# Patient Record
Sex: Female | Born: 1970 | Race: Black or African American | Hispanic: No | Marital: Single | Smoking: Current every day smoker
Health system: Southern US, Community
[De-identification: ages and names within clinical notes are randomized; demographics above are authoritative.]

## PROBLEM LIST (undated history)

## (undated) DIAGNOSIS — F32A Depression, unspecified: Secondary | ICD-10-CM

## (undated) DIAGNOSIS — F329 Major depressive disorder, single episode, unspecified: Secondary | ICD-10-CM

## (undated) DIAGNOSIS — G43909 Migraine, unspecified, not intractable, without status migrainosus: Secondary | ICD-10-CM

## (undated) DIAGNOSIS — A64 Unspecified sexually transmitted disease: Secondary | ICD-10-CM

## (undated) HISTORY — DX: Migraine, unspecified, not intractable, without status migrainosus: G43.909

## (undated) HISTORY — DX: Major depressive disorder, single episode, unspecified: F32.9

## (undated) HISTORY — DX: Depression, unspecified: F32.A

## (undated) HISTORY — DX: Unspecified sexually transmitted disease: A64

## (undated) HISTORY — PX: TONSILLECTOMY: SHX5217

---

## 1996-12-09 HISTORY — PX: TUBAL LIGATION: SHX77

## 2002-01-08 ENCOUNTER — Emergency Department (HOSPITAL_COMMUNITY): Admission: EM | Admit: 2002-01-08 | Discharge: 2002-01-08 | Payer: Self-pay | Admitting: *Deleted

## 2003-11-22 ENCOUNTER — Inpatient Hospital Stay (HOSPITAL_COMMUNITY): Admission: AD | Admit: 2003-11-22 | Discharge: 2003-11-22 | Payer: Self-pay | Admitting: *Deleted

## 2005-08-01 ENCOUNTER — Other Ambulatory Visit: Admission: RE | Admit: 2005-08-01 | Discharge: 2005-08-01 | Payer: Self-pay | Admitting: Gynecology

## 2006-02-17 ENCOUNTER — Emergency Department (HOSPITAL_COMMUNITY): Admission: EM | Admit: 2006-02-17 | Discharge: 2006-02-17 | Payer: Self-pay | Admitting: Family Medicine

## 2006-08-12 ENCOUNTER — Other Ambulatory Visit: Admission: RE | Admit: 2006-08-12 | Discharge: 2006-08-12 | Payer: Self-pay | Admitting: Gynecology

## 2006-11-25 ENCOUNTER — Emergency Department (HOSPITAL_COMMUNITY): Admission: EM | Admit: 2006-11-25 | Discharge: 2006-11-25 | Payer: Self-pay | Admitting: Family Medicine

## 2006-12-09 HISTORY — PX: BREAST SURGERY: SHX581

## 2007-03-13 ENCOUNTER — Encounter: Admission: RE | Admit: 2007-03-13 | Discharge: 2007-03-13 | Payer: Self-pay | Admitting: Gynecology

## 2007-12-23 ENCOUNTER — Other Ambulatory Visit: Admission: RE | Admit: 2007-12-23 | Discharge: 2007-12-23 | Payer: Self-pay | Admitting: Gynecology

## 2009-04-17 ENCOUNTER — Inpatient Hospital Stay (HOSPITAL_COMMUNITY): Admission: EM | Admit: 2009-04-17 | Discharge: 2009-04-20 | Payer: Self-pay | Admitting: Emergency Medicine

## 2009-05-05 ENCOUNTER — Ambulatory Visit: Payer: Self-pay | Admitting: Women's Health

## 2009-08-24 ENCOUNTER — Emergency Department (HOSPITAL_COMMUNITY): Admission: EM | Admit: 2009-08-24 | Discharge: 2009-08-25 | Payer: Self-pay | Admitting: Emergency Medicine

## 2009-09-29 ENCOUNTER — Ambulatory Visit: Payer: Self-pay | Admitting: Women's Health

## 2010-05-18 ENCOUNTER — Ambulatory Visit: Payer: Self-pay | Admitting: Women's Health

## 2010-05-18 ENCOUNTER — Other Ambulatory Visit: Admission: RE | Admit: 2010-05-18 | Discharge: 2010-05-18 | Payer: Self-pay | Admitting: Obstetrics and Gynecology

## 2010-08-03 ENCOUNTER — Emergency Department (HOSPITAL_COMMUNITY): Admission: EM | Admit: 2010-08-03 | Discharge: 2010-08-03 | Payer: Self-pay | Admitting: Emergency Medicine

## 2011-02-21 LAB — WET PREP, GENITAL
Trich, Wet Prep: NONE SEEN
Yeast Wet Prep HPF POC: NONE SEEN

## 2011-02-21 LAB — URINALYSIS, ROUTINE W REFLEX MICROSCOPIC
Glucose, UA: NEGATIVE mg/dL
Hgb urine dipstick: NEGATIVE
Specific Gravity, Urine: 1.022 (ref 1.005–1.030)
pH: 6.5 (ref 5.0–8.0)

## 2011-02-21 LAB — GC/CHLAMYDIA PROBE AMP, GENITAL

## 2011-02-21 LAB — POCT PREGNANCY, URINE

## 2011-03-19 LAB — COMPREHENSIVE METABOLIC PANEL
ALT: 17 U/L (ref 0–35)
AST: 18 U/L (ref 0–37)
Albumin: 2.9 g/dL — ABNORMAL LOW (ref 3.5–5.2)
Alkaline Phosphatase: 56 U/L (ref 39–117)
BUN: 6 mg/dL (ref 6–23)
CO2: 23 mEq/L (ref 19–32)
CO2: 25 mEq/L (ref 19–32)
Chloride: 104 mEq/L (ref 96–112)
Chloride: 106 mEq/L (ref 96–112)
Creatinine, Ser: 0.51 mg/dL (ref 0.4–1.2)
GFR calc non Af Amer: >60 mL/min (ref >60)
GFR calc non Af Amer: >60 mL/min (ref >60)
Glucose, Bld: 118 mg/dL — ABNORMAL HIGH (ref 70–99)
Potassium: 3.6 mEq/L (ref 3.5–5.1)
Sodium: 135 mEq/L (ref 135–145)
Total Bilirubin: 0.6 mg/dL (ref 0.3–1.2)
Total Bilirubin: 1 mg/dL (ref 0.3–1.2)

## 2011-03-19 LAB — CULTURE, BLOOD (ROUTINE X 2)

## 2011-03-19 LAB — WET PREP, GENITAL: Yeast Wet Prep HPF POC: NONE SEEN

## 2011-03-19 LAB — DIFFERENTIAL
Basophils Absolute: 0 10*3/uL (ref 0.0–0.1)
Basophils Relative: 0 % (ref 0–1)
Eosinophils Absolute: 0 10*3/uL (ref 0.0–0.7)
Neutro Abs: 10.7 10*3/uL — ABNORMAL HIGH (ref 1.7–7.7)
Neutrophils Relative %: 87 % — ABNORMAL HIGH (ref 43–77)

## 2011-03-19 LAB — IRON AND TIBC
Iron: 16 ug/dL — ABNORMAL LOW (ref 42–135)
Saturation Ratios: 6 % — ABNORMAL LOW (ref 20–55)
TIBC: 276 ug/dL (ref 250–470)

## 2011-03-19 LAB — URINALYSIS, ROUTINE W REFLEX MICROSCOPIC
Glucose, UA: NEGATIVE mg/dL
Ketones, ur: >80 mg/dL — AB
Protein, ur: 100 mg/dL — AB
pH: 6 (ref 5.0–8.0)

## 2011-03-19 LAB — CBC
HCT: 32.6 % — ABNORMAL LOW (ref 36.0–46.0)
Hemoglobin: 12.4 g/dL (ref 12.0–15.0)
MCHC: 34.3 g/dL (ref 30.0–36.0)
MCV: 84.2 fL (ref 78.0–100.0)
Platelets: 112 10*3/uL — ABNORMAL LOW (ref 150–400)
Platelets: 126 10*3/uL — ABNORMAL LOW (ref 150–400)
Platelets: 134 10*3/uL — ABNORMAL LOW (ref 150–400)
RBC: 3.82 MIL/uL — ABNORMAL LOW (ref 3.87–5.11)
RBC: 3.83 MIL/uL — ABNORMAL LOW (ref 3.87–5.11)
RBC: 4.31 MIL/uL (ref 3.87–5.11)
RDW: 14.1 % (ref 11.5–15.5)
WBC: 11.5 10*3/uL — ABNORMAL HIGH (ref 4.0–10.5)
WBC: 7.5 10*3/uL (ref 4.0–10.5)
WBC: 9.2 10*3/uL (ref 4.0–10.5)

## 2011-03-19 LAB — BASIC METABOLIC PANEL
BUN: 1 mg/dL — ABNORMAL LOW (ref 6–23)
Calcium: 8.5 mg/dL (ref 8.4–10.5)
Creatinine, Ser: 0.49 mg/dL (ref 0.4–1.2)
GFR calc non Af Amer: >60 mL/min (ref >60)

## 2011-03-19 LAB — URINE CULTURE: Colony Count: >=100000

## 2011-03-19 LAB — HEMOCCULT GUIAC POC 1CARD (OFFICE): Fecal Occult Bld: NEGATIVE

## 2011-03-19 LAB — URINE MICROSCOPIC-ADD ON

## 2011-03-19 LAB — POCT I-STAT, CHEM 8
Chloride: 103 mEq/L (ref 96–112)
HCT: 37 % (ref 36.0–46.0)
Hemoglobin: 12.6 g/dL (ref 12.0–15.0)
Potassium: 3 mEq/L — ABNORMAL LOW (ref 3.5–5.1)
Sodium: 138 mEq/L (ref 135–145)

## 2011-03-19 LAB — POCT PREGNANCY, URINE

## 2011-03-19 LAB — GC/CHLAMYDIA PROBE AMP, GENITAL

## 2011-03-19 LAB — VITAMIN B12: Vitamin B-12: 572 pg/mL (ref 211–911)

## 2011-04-23 NOTE — Discharge Summary (Signed)
Whitney Gay, Whitney Gay                ACCOUNT NO.:  [id number]   MEDICAL RECORD NO.:  [id number]          PATIENT TYPE:  INP   LOCATION:  3039                         FACILITY:  [facility]   PHYSICIAN:  Theodosia Paling, MD    DATE OF BIRTH:  1971/07/26   DATE OF ADMISSION:  04/17/2009  DATE OF DISCHARGE:  04/20/2009                               DISCHARGE SUMMARY   PRIMARY CARE PHYSICIAN:  Dr. Juanda Chance at [facility].  She is a gynecologist.  The patient does not have a primary care  physician at this time.   HISTORY OF PRESENT ILLNESS:  Please refer to the excellent admission  note dictated by Dr. Sabino Donovan.   DISCHARGE DIAGNOSES:  1. Urinary tract infection due to E coli sensitive ciprofloxacin.  2. Bacteria aeruginosa.  3. History of depression.  4. Anemia.  5. Mild thrombocytopenia.   DISCHARGE MEDICATIONS:  1. Ciprofloxacin 500 mg p.o. q.12h for 10 days.  2,  Tinidazole 2 g p.o. daily for 2 days.   HOSPITAL COURSE:  The following issues were addressed during the  hospitalization.  1. Urinary tract infection.  The patient remained hemodynamically      stable and defervesced.  WBC count was normal on discharge.  She      was tolerating p.o. intake very well.  Urine culture for E coli      sensitive to ciprofloxacin.  She will completing a 10-day course of      ciprofloxacin and follow up with Dr. Juanda Chance in one week.  2. Bacteria aeruginosa.  I am giving her a 2-day supply of Tinidazole      .  3. Depression.  The patient did not have any exacerbation, suicidal      ideation, etc.  4. Anemia.  The patient did not have any need for transfusion.      However, her blood work can be done as an outpatient.  However, I      am sending iron studies, , B12, folate level to rule out her, stool      for occult blood and HIV testing for finding out the cause of      anemia as well as thrombocytopenia.  5. Thrombocytopenia.  I am sending a HIV panel on her .   Imaging performed Apr 17, 2009, transvaginal ultrasound that showed 2 cm  hemorrhagic right ovarian cyst and left ovary was normal.  Ultrasound of  the pelvis was normal.   DISPOSITION:  The patient is planning to follow up with [name], PA  at [place], in one week for further evaluation and management of  anemia and thrombocytopenia and referral for primary care physician.  I have discussed above plan with her and [name] will be following  up on her anemia work up.   Discharge time:  1 hour 15 minutes.  Please CC above dc summary to [name] at [place], Gyne[facility] [place]      Theodosia Paling, MD  Electronically Signed     NP/MEDQ  D:  04/20/2009  T:  04/20/2009  Job:  [id number]  cc:   Juanda Chance, M.D.

## 2011-04-23 NOTE — H&P (Signed)
NAMELAVONDA, Whitney Gay                ACCOUNT NO.:  [id number]   MEDICAL RECORD NO.:  [id number]          PATIENT TYPE:  INP   LOCATION:  3039                         FACILITY:  [facility]   PHYSICIAN:  Sabino Donovan, MD        DATE OF BIRTH:  20-Mar-1971   DATE OF ADMISSION:  04/17/2009  DATE OF DISCHARGE:                              HISTORY & PHYSICAL   CHIEF COMPLAINT:  Back pain.   HISTORY OF PRESENT ILLNESS:  The patient is a 40 year old African  American female with a history of depression and eczema presented with  back pain for last 3 days.  Reports that pain started about 3 days ago,  has a lower back pain and last night became severely worse.  She also  reports fever and chills associated with it, although did not check her  temperature at home.  Otherwise, denies any nausea, vomiting, diarrhea,  constipation, chest pain, or shortness of breath.  The patient does  report that she is sexually active, reports clear vaginal discharge with  foul odor.  In the ER, the patient was noted to have UTI with multiple  clue cells and being admitted for pelvic inflammatory disease.   PAST HISTORY:  1. Depression.  2. Eczema.   FAMILY HISTORY:  Noncontributory.   SOCIAL HISTORY:  She has history of tobacco, alcohol, no IV drug use.   DRUG ALLERGIES:  No known drug allergies.   MEDICATION:  None.   REVIEW OF SYSTEMS:  Otherwise, unremarkable.   PHYSICAL EXAMINATION:  VITAL SIGNS:  Temperature 101.7, pulse 94,  respiratory rate 16, and blood pressure 128/81.  GENERAL:  She is sleepy, otherwise, in no acute distress.  HEENT:  PERRLA.  EOMI.  NECK:  No lymphadenopathy, thyromegaly, or JVD.  CHEST:  Clear to auscultation bilaterally.  CARDIOVASCULAR:  Regular rate and rhythm.  No murmurs, rubs, or gallops.  ABDOMEN:  Soft, nontender, and nondistended.  Normoactive bowel sounds.  EXTREMITIES:  No clubbing, cyanosis, or edema.  BACK:  Denies any CVA tenderness.  NEUROLOGIC:  Focally  intact.   LABORATORY DATA:  Sodium 138, potassium 3.0, BUN 6, creatinine 0.8,  white count 12.4, H&H 11.8 and 34.4, platelets 134.  Urinalysis showed  large hemoglobin, nitrite positive, large leukocyte esterase positive,  moderate clue cells.  Ultrasound of vagina showed probable 2 cm  hemorrhagic ovarian cyst.   IMPRESSION:  The patient is a 41 year old white female with urinary  tract infection versus pelvic inflammatory disease.  1. Pelvic inflammatory disease/urinary tract infection.  We will treat      with Rocephin as well as Flagyl to cover for Gardnerella vaginosis.      We will give aggressive IV fluids and send a urine for culture and      follow response.  2. Prophylaxis, none needed.      Sabino Donovan, MD  Electronically Signed     MJ/MEDQ  D:  04/17/2009  T:  04/18/2009  Job:  [id number]

## 2011-05-20 ENCOUNTER — Encounter: Payer: Self-pay | Admitting: Women's Health

## 2011-05-21 ENCOUNTER — Other Ambulatory Visit: Payer: Self-pay | Admitting: Women's Health

## 2011-05-21 ENCOUNTER — Encounter (INDEPENDENT_AMBULATORY_CARE_PROVIDER_SITE_OTHER): Payer: PRIVATE HEALTH INSURANCE | Admitting: Women's Health

## 2011-05-21 ENCOUNTER — Other Ambulatory Visit (HOSPITAL_COMMUNITY)
Admission: RE | Admit: 2011-05-21 | Discharge: 2011-05-21 | Disposition: A | Discharge status: Home / Self Care | Payer: PRIVATE HEALTH INSURANCE | Source: Ambulatory Visit | Attending: Obstetrics and Gynecology | Admitting: Obstetrics and Gynecology

## 2011-05-21 DIAGNOSIS — Z833 Family history of diabetes mellitus: Secondary | ICD-10-CM

## 2011-05-21 DIAGNOSIS — Z124 Encounter for screening for malignant neoplasm of cervix: Secondary | ICD-10-CM | POA: Insufficient documentation

## 2011-05-21 DIAGNOSIS — Z01419 Encounter for gynecological examination (general) (routine) without abnormal findings: Secondary | ICD-10-CM

## 2011-05-21 DIAGNOSIS — E079 Disorder of thyroid, unspecified: Secondary | ICD-10-CM

## 2011-05-21 DIAGNOSIS — R82998 Other abnormal findings in urine: Secondary | ICD-10-CM

## 2011-05-21 DIAGNOSIS — Z113 Encounter for screening for infections with a predominantly sexual mode of transmission: Secondary | ICD-10-CM

## 2011-12-16 ENCOUNTER — Telehealth: Payer: Self-pay | Admitting: *Deleted

## 2011-12-16 DIAGNOSIS — F419 Anxiety disorder, unspecified: Secondary | ICD-10-CM

## 2011-12-16 NOTE — Telephone Encounter (Signed)
Pt would like to speak with you, she wants a refill on her medication she took for depression. I am assuming she meant Zoloft, pt couldn't remember the name of medication. She did not want to talk to me about this only you. Call back number [phone] Please advise

## 2011-12-16 NOTE — Telephone Encounter (Signed)
Left message on cell to return call.

## 2011-12-18 NOTE — Telephone Encounter (Signed)
Message left on cell to call 

## 2011-12-19 MED ORDER — ALPRAZOLAM 0.25 MG PO TABS: 0.2500 mg | ORAL_TABLET | Freq: Every evening | ORAL | Status: DC | PRN

## 2011-12-19 NOTE — Telephone Encounter (Signed)
States has had some increased situational anxiety and discharge. Requests Xanax 0.25 is aware to use sparingly. Discussed needs office visit to check discharged switch to scheduling.

## 2012-01-01 ENCOUNTER — Ambulatory Visit (INDEPENDENT_AMBULATORY_CARE_PROVIDER_SITE_OTHER): Payer: Self-pay | Admitting: Women's Health

## 2012-01-01 ENCOUNTER — Encounter: Payer: Self-pay | Admitting: Women's Health

## 2012-01-01 DIAGNOSIS — F411 Generalized anxiety disorder: Secondary | ICD-10-CM

## 2012-01-01 DIAGNOSIS — N898 Other specified noninflammatory disorders of vagina: Secondary | ICD-10-CM

## 2012-01-01 DIAGNOSIS — F419 Anxiety disorder, unspecified: Secondary | ICD-10-CM

## 2012-01-01 LAB — WET PREP FOR TRICH, YEAST, CLUE: Clue Cells Wet Prep HPF POC: NONE SEEN

## 2012-01-01 MED ORDER — ALPRAZOLAM 0.25 MG PO TABS: 0.2500 mg | ORAL_TABLET | Freq: Every evening | ORAL | Status: AC | PRN

## 2012-01-01 MED ORDER — METRONIDAZOLE 500 MG PO TABS: 500.0000 mg | ORAL_TABLET | Freq: Two times a day (BID) | ORAL | Status: AC

## 2012-01-01 NOTE — Progress Notes (Signed)
Patient ID: Whitney Gay, female   DOB: [birth year], 41 y.o.   MRN: [id number] Presents with a complaint of a discharge with an odor. Has not been sexually active in greater than 6 months, negative cultures. Monthly cycle/ BTL. Occasional situational anxiety, requests Xanax 0.25 , uses sparingly.  Exam: External genitalia within normal limits, speculum exam cervix is pink healthy without lesion, vaginal odor noted, wet prep pending. Bimanual no CMT or adnexal fullness or tenderness.  Trichomonas Anxiety  Plan: Flagyl 2 g by mouth x1 dose, prescription, proper use given and reviewed. Condoms encouraged if becomes sexually active. Prescription for Xanax 0.25, reviewed importance to use sparingly, addictive properties reviewed.

## 2012-01-03 ENCOUNTER — Ambulatory Visit: Payer: PRIVATE HEALTH INSURANCE | Admitting: Women's Health

## 2012-01-04 ENCOUNTER — Other Ambulatory Visit: Payer: Self-pay | Admitting: Women's Health

## 2012-01-06 ENCOUNTER — Encounter: Payer: Self-pay | Admitting: *Deleted

## 2012-01-06 NOTE — Progress Notes (Signed)
Patient ID: Whitney Gay, female   DOB: 08/28/1971, 41 y.o.   MRN: [id number] Pt said pharmacy never got flagyl 500 mg rx. rx called in to pharmacy from last office visit.

## 2012-11-20 ENCOUNTER — Encounter: Payer: Self-pay | Admitting: Women's Health

## 2014-05-26 ENCOUNTER — Other Ambulatory Visit: Payer: Self-pay | Admitting: Women's Health

## 2014-05-26 ENCOUNTER — Ambulatory Visit (INDEPENDENT_AMBULATORY_CARE_PROVIDER_SITE_OTHER): Payer: 59 | Admitting: Women's Health

## 2014-05-26 ENCOUNTER — Telehealth: Payer: Self-pay | Admitting: *Deleted

## 2014-05-26 ENCOUNTER — Encounter: Payer: Self-pay | Admitting: Women's Health

## 2014-05-26 ENCOUNTER — Other Ambulatory Visit (HOSPITAL_COMMUNITY)
Admission: RE | Admit: 2014-05-26 | Discharge: 2014-05-26 | Disposition: A | Discharge status: Home / Self Care | Payer: 59 | Source: Ambulatory Visit | Attending: Women's Health | Admitting: Women's Health

## 2014-05-26 VITALS — BP 110/72 | Ht 66.5 in | Wt 124.0 lb

## 2014-05-26 DIAGNOSIS — Z1151 Encounter for screening for human papillomavirus (HPV): Secondary | ICD-10-CM | POA: Insufficient documentation

## 2014-05-26 DIAGNOSIS — E079 Disorder of thyroid, unspecified: Secondary | ICD-10-CM

## 2014-05-26 DIAGNOSIS — N63 Unspecified lump in unspecified breast: Secondary | ICD-10-CM

## 2014-05-26 DIAGNOSIS — D242 Benign neoplasm of left breast: Secondary | ICD-10-CM

## 2014-05-26 DIAGNOSIS — Z01419 Encounter for gynecological examination (general) (routine) without abnormal findings: Secondary | ICD-10-CM

## 2014-05-26 DIAGNOSIS — Z113 Encounter for screening for infections with a predominantly sexual mode of transmission: Secondary | ICD-10-CM

## 2014-05-26 DIAGNOSIS — A5901 Trichomonal vulvovaginitis: Secondary | ICD-10-CM

## 2014-05-26 LAB — CBC WITH DIFFERENTIAL/PLATELET
BASOS ABS: 0 10*3/uL (ref 0.0–0.1)
BASOS PCT: 0 % (ref 0–1)
EOS PCT: 4 % (ref 0–5)
Eosinophils Absolute: 0.2 10*3/uL (ref 0.0–0.7)
HEMATOCRIT: 39.7 % (ref 36.0–46.0)
Hemoglobin: 13.5 g/dL (ref 12.0–15.0)
Lymphocytes Relative: 27 % (ref 12–46)
Lymphs Abs: 1.5 10*3/uL (ref 0.7–4.0)
MCH: 27.7 pg (ref 26.0–34.0)
MCHC: 34 g/dL (ref 30.0–36.0)
MCV: 81.4 fL (ref 78.0–100.0)
MONO ABS: 0.4 10*3/uL (ref 0.1–1.0)
Monocytes Relative: 7 % (ref 3–12)
Neutro Abs: 3.5 10*3/uL (ref 1.7–7.7)
Neutrophils Relative %: 62 % (ref 43–77)
Platelets: 212 10*3/uL (ref 150–400)
RBC: 4.88 MIL/uL (ref 3.87–5.11)
RDW: 15 % (ref 11.5–15.5)
WBC: 5.7 10*3/uL (ref 4.0–10.5)

## 2014-05-26 LAB — COMPREHENSIVE METABOLIC PANEL
ALBUMIN: 4.3 g/dL (ref 3.5–5.2)
ALT: 10 U/L (ref 0–35)
AST: 14 U/L (ref 0–37)
Alkaline Phosphatase: 65 U/L (ref 39–117)
BUN: 7 mg/dL (ref 6–23)
CALCIUM: 9.3 mg/dL (ref 8.4–10.5)
CHLORIDE: 105 meq/L (ref 96–112)
CO2: 25 meq/L (ref 19–32)
CREATININE: 0.66 mg/dL (ref 0.50–1.10)
Glucose, Bld: 89 mg/dL (ref 70–99)
POTASSIUM: 3.9 meq/L (ref 3.5–5.3)
Sodium: 137 mEq/L (ref 135–145)
TOTAL PROTEIN: 7 g/dL (ref 6.0–8.3)
Total Bilirubin: 0.5 mg/dL (ref 0.2–1.2)

## 2014-05-26 LAB — WET PREP FOR TRICH, YEAST, CLUE: YEAST WET PREP: NONE SEEN

## 2014-05-26 MED ORDER — METRONIDAZOLE 500 MG PO TABS: ORAL_TABLET | ORAL | Status: DC

## 2014-05-26 NOTE — Patient Instructions (Signed)

## 2014-05-26 NOTE — Telephone Encounter (Signed)
Order placed at breast center , they will contract patient.

## 2014-05-26 NOTE — Progress Notes (Signed)
Whitney Gay August 31, 1971 [id number]    History:    Presents for annual exam.  Monthly cycle for 6-7 days/BTL/not sexually active for months. Normal Pap history. 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 Has not been in for 2 years due to  loss of insurance. Complaint of fatigue, history of anemia.  Past medical history, past surgical history, family history and social history were all reviewed and documented in the EPIC chart. Daughter 30 pregnant, son 63 doing okay. Mother diabetes.  ROS:  A  12 point ROS was performed and pertinent positives and negatives are included.  Exam:  Filed Vitals:   05/26/14 0754  BP: 110/72    General appearance:  Normal Thyroid:  Symmetrical, normal in size, without palpable masses or nodularity. Respiratory  Auscultation:  Clear without wheezing or rhonchi Cardiovascular  Auscultation:  Regular rate, without rubs, murmurs or gallops  Edema/varicosities:  Not grossly evident Abdominal  Soft,nontender, without masses, guarding or rebound.  Liver/spleen:  No organomegaly noted  Hernia:  None appreciated  Skin  Inspection:  Grossly normal   Breasts: Examined lying and sitting.     Right: Without masses, retractions, discharge or axillary adenopathy.     Left: 2 cm mobile nontender, smooth nodule our aspect at areola  Gentitourinary   Inguinal/mons:  Normal without inguinal adenopathy  External genitalia:  Normal  BUS/Urethra/Skene's glands:  Normal  Vagina:  Moderate white discharge with odor, wet prep positive for trichomonas  Cervix:  Normal  Uterus:   normal in size, shape and contour.  Midline and mobile  Adnexa/parametria:     Rt: Without masses or tenderness.   Lt: Without masses or tenderness.  Anus and perineum: Normal  Digital rectal exam: Normal sphincter tone without palpated masses or tenderness  Assessment/Plan:  43 y.o. SBF G3P2 for annual exam with complaint of fatigue.    Trichomonas Monthly 6-7  day cycle/BTL Left breast probable fibroadenoma STD screen  Plan: Flagyl 2 g by mouth x1 dose prescription, proper use given and reviewed alcohol precautions, condoms encouraged. SBE's, will schedule diagnostic mammogram of left breast, reviewed annual screening mammograms. Regular exercise, calcium and iron rich diet, vitamin D 1000 daily encouraged. CBC, comprehensive metabolic panel, UA, Pap with HR HPV typing, GC/Chlamydia, HIV, hep B, C., RPR.     Note: This dictation was prepared with Dragon/digital dictation.  Any transcriptional errors that result are unintentional. Whitney Gay, 8:42 AM 05/26/2014

## 2014-05-26 NOTE — Telephone Encounter (Signed)
Appointment 06/07/14 @ 10:15 am

## 2014-05-26 NOTE — Telephone Encounter (Signed)
Message copied by Thamas Jaegers on Thu May 26, 2014  9:03 AM ------      Message from: [place], [place]      Created: Thu May 26, 2014  8:19 AM       Please schedule diagnostic mammogram, left breast 2 cm mobile nodule at areola   hx of fibroadenoma   ------

## 2014-05-27 LAB — URINALYSIS W MICROSCOPIC + REFLEX CULTURE
BACTERIA UA: NONE SEEN
CRYSTALS: NONE SEEN
Casts: NONE SEEN
GLUCOSE, UA: NEGATIVE mg/dL
Hgb urine dipstick: NEGATIVE
KETONES UR: NEGATIVE mg/dL
Nitrite: NEGATIVE
PROTEIN: NEGATIVE mg/dL
SPECIFIC GRAVITY, URINE: 1.026 (ref 1.005–1.030)
UROBILINOGEN UA: 1 mg/dL (ref 0.0–1.0)
pH: 5.5 (ref 5.0–8.0)

## 2014-05-27 LAB — HEPATITIS C ANTIBODY: HCV Ab: NEGATIVE

## 2014-05-27 LAB — HIV ANTIBODY (ROUTINE TESTING W REFLEX): HIV 1&2 Ab, 4th Generation: NONREACTIVE

## 2014-05-27 LAB — CYTOLOGY - PAP

## 2014-05-27 LAB — RPR

## 2014-05-27 LAB — HEPATITIS B SURFACE ANTIGEN: HEP B S AG: NEGATIVE

## 2014-05-27 LAB — TSH: TSH: 0.496 u[IU]/mL (ref 0.350–4.500)

## 2014-05-28 LAB — URINE CULTURE
Colony Count: NO GROWTH
ORGANISM ID, BACTERIA: NO GROWTH

## 2014-05-28 LAB — GC/CHLAMYDIA PROBE AMP
CT Probe RNA: NEGATIVE
GC Probe RNA: NEGATIVE

## 2014-06-07 ENCOUNTER — Other Ambulatory Visit: Payer: Self-pay

## 2014-06-17 ENCOUNTER — Other Ambulatory Visit: Payer: Self-pay | Admitting: Women's Health

## 2014-06-17 ENCOUNTER — Telehealth: Payer: Self-pay

## 2014-06-17 DIAGNOSIS — A5901 Trichomonal vulvovaginitis: Secondary | ICD-10-CM

## 2014-06-17 MED ORDER — ALPRAZOLAM 0.25 MG PO TABS: 0.2500 mg | ORAL_TABLET | Freq: Three times a day (TID) | ORAL | Status: DC

## 2014-06-17 MED ORDER — METRONIDAZOLE 500 MG PO TABS: ORAL_TABLET | ORAL | Status: DC

## 2014-06-17 NOTE — Telephone Encounter (Signed)
Ok to refill flagyl and call in xanax .25mg  every 8 hours as needed  #30  Review to use sparingly.

## 2014-06-17 NOTE — Telephone Encounter (Signed)
Patient said she lost her Rx for Metronidazole in the process of moving and wants to know if we could sent it again.  Pharmacy has been updated in system to reflect her move.  Also, patient tells me that she is feeling overwhelmed and that you had prescribed her medication for depression before but she did not take it.  She said she wanted it now.  She has a "situation" that she said you are aware of . She has recently moved and it overwhelmed with situation.  I told her I did not see antidepressant in her meds but did see where she received Xanax for anxiety.  She said that was what she thought she meant.  She wanted to see if you would need to see her or could just talk with her.

## 2014-06-17 NOTE — Telephone Encounter (Signed)
I reminded patient that Xanax can be addictive and to use these as sparingly as she can. She promises she will. Rx's called in to her new pharmacy.

## 2014-06-21 ENCOUNTER — Other Ambulatory Visit: Payer: 59

## 2014-10-10 ENCOUNTER — Encounter: Payer: Self-pay | Admitting: Women's Health

## 2014-12-14 ENCOUNTER — Telehealth: Payer: Self-pay | Admitting: *Deleted

## 2014-12-14 NOTE — Telephone Encounter (Signed)
Pt called and left message requesting refill for Rx but left no name of Rx. I asked pt to call me with name of medication.

## 2015-06-28 ENCOUNTER — Encounter: Payer: Self-pay | Admitting: Women's Health

## 2017-01-09 ENCOUNTER — Ambulatory Visit: Payer: Self-pay | Admitting: Women's Health

## 2017-01-10 ENCOUNTER — Encounter: Payer: Self-pay | Admitting: Women's Health

## 2017-01-10 ENCOUNTER — Ambulatory Visit (INDEPENDENT_AMBULATORY_CARE_PROVIDER_SITE_OTHER): Payer: No Typology Code available for payment source | Admitting: Women's Health

## 2017-01-10 VITALS — BP 144/86 | Ht 66.5 in | Wt 141.0 lb

## 2017-01-10 DIAGNOSIS — N760 Acute vaginitis: Secondary | ICD-10-CM

## 2017-01-10 DIAGNOSIS — Z13220 Encounter for screening for lipoid disorders: Secondary | ICD-10-CM

## 2017-01-10 DIAGNOSIS — B9689 Other specified bacterial agents as the cause of diseases classified elsewhere: Secondary | ICD-10-CM | POA: Diagnosis not present

## 2017-01-10 DIAGNOSIS — Z1151 Encounter for screening for human papillomavirus (HPV): Secondary | ICD-10-CM

## 2017-01-10 DIAGNOSIS — Z01419 Encounter for gynecological examination (general) (routine) without abnormal findings: Secondary | ICD-10-CM | POA: Diagnosis not present

## 2017-01-10 DIAGNOSIS — A5901 Trichomonal vulvovaginitis: Secondary | ICD-10-CM | POA: Insufficient documentation

## 2017-01-10 DIAGNOSIS — N898 Other specified noninflammatory disorders of vagina: Secondary | ICD-10-CM | POA: Diagnosis not present

## 2017-01-10 DIAGNOSIS — N946 Dysmenorrhea, unspecified: Secondary | ICD-10-CM

## 2017-01-10 DIAGNOSIS — Z113 Encounter for screening for infections with a predominantly sexual mode of transmission: Secondary | ICD-10-CM | POA: Diagnosis not present

## 2017-01-10 LAB — COMPREHENSIVE METABOLIC PANEL
ALK PHOS: 59 U/L (ref 33–115)
ALT: 8 U/L (ref 6–29)
AST: 13 U/L (ref 10–30)
Albumin: 4 g/dL (ref 3.6–5.1)
BUN: 6 mg/dL — ABNORMAL LOW (ref 7–25)
CALCIUM: 8.7 mg/dL (ref 8.6–10.2)
CO2: 25 mmol/L (ref 20–31)
Chloride: 108 mmol/L (ref 98–110)
Creat: 0.58 mg/dL (ref 0.50–1.10)
GLUCOSE: 92 mg/dL (ref 65–99)
POTASSIUM: 3.7 mmol/L (ref 3.5–5.3)
Sodium: 139 mmol/L (ref 135–146)
Total Bilirubin: 0.3 mg/dL (ref 0.2–1.2)
Total Protein: 6.6 g/dL (ref 6.1–8.1)

## 2017-01-10 LAB — CBC WITH DIFFERENTIAL/PLATELET
BASOS ABS: 63 {cells}/uL (ref 0–200)
Basophils Relative: 1 %
EOS ABS: 378 {cells}/uL (ref 15–500)
Eosinophils Relative: 6 %
HEMATOCRIT: 39 % (ref 35.0–45.0)
Hemoglobin: 12.9 g/dL (ref 11.7–15.5)
LYMPHS PCT: 34 %
Lymphs Abs: 2142 cells/uL (ref 850–3900)
MCH: 27.6 pg (ref 27.0–33.0)
MCHC: 33.1 g/dL (ref 32.0–36.0)
MCV: 83.3 fL (ref 80.0–100.0)
MONO ABS: 567 {cells}/uL (ref 200–950)
MPV: 10.8 fL (ref 7.5–12.5)
Monocytes Relative: 9 %
NEUTROS PCT: 50 %
Neutro Abs: 3150 cells/uL (ref 1500–7800)
Platelets: 218 10*3/uL (ref 140–400)
RBC: 4.68 MIL/uL (ref 3.80–5.10)
RDW: 15.1 % — AB (ref 11.0–15.0)
WBC: 6.3 10*3/uL (ref 3.8–10.8)

## 2017-01-10 LAB — LIPID PANEL
CHOL/HDL RATIO: 3.4 ratio (ref <5.0)
Cholesterol: 159 mg/dL (ref <200)
HDL: 47 mg/dL — AB (ref >50)
LDL CALC: 93 mg/dL (ref <100)
TRIGLYCERIDES: 97 mg/dL (ref <150)
VLDL: 19 mg/dL (ref <30)

## 2017-01-10 LAB — WET PREP FOR TRICH, YEAST, CLUE: Yeast Wet Prep HPF POC: NONE SEEN

## 2017-01-10 MED ORDER — IBUPROFEN 600 MG PO TABS: 600.0000 mg | ORAL_TABLET | Freq: Three times a day (TID) | ORAL | 1 refills | Status: DC | PRN

## 2017-01-10 MED ORDER — METRONIDAZOLE 500 MG PO TABS: 500.0000 mg | ORAL_TABLET | Freq: Two times a day (BID) | ORAL | 0 refills | Status: DC

## 2017-01-10 NOTE — Addendum Note (Signed)
Addended by: Thurnell Garbe A on: 01/10/2017 12:00 PM   Modules accepted: Orders

## 2017-01-10 NOTE — Addendum Note (Signed)
Addended by: Joaquin Music on: 01/10/2017 11:35 AM   Modules accepted: Orders

## 2017-01-10 NOTE — Progress Notes (Signed)
Whitney Gay 1970/12/18 [id number]    History:    Presents for annual exam.  Regular monthly 6-7 day cycle lash BTL, same partner for the past year. Last  in 2015. Has not had a recent mammogram. 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 Normal Pap history.  Past medical history, past surgical history, family history and social history were all reviewed and documented in the EPIC chart. Office work. Daughter has had 3 children, has placed 2 for adoption. Son 33 has had some problems with arrest.  ROS:  A ROS was performed and pertinent positives and negatives are included.  Exam:  Vitals:   01/10/17 1008  BP: (!) 144/86  Weight: 141 lb (64 kg)  Height: 5' 6.5" (1.689 m)   Body mass index is 22.42 kg/m.   General appearance:  Normal Thyroid:  Symmetrical, normal in size, without palpable masses or nodularity. Respiratory  Auscultation:  Clear without wheezing or rhonchi Cardiovascular  Auscultation:  Regular rate, without rubs, murmurs or gallops  Edema/varicosities:  Not grossly evident Abdominal  Soft,nontender, without masses, guarding or rebound.  Liver/spleen:  No organomegaly noted  Hernia:  None appreciated  Skin  Inspection:  Grossly normal   Breasts: Examined lying and sitting.     Right: Without masses, retractions, discharge or axillary adenopathy.     Left: Without masses, retractions, discharge or axillary adenopathy. Gentitourinary   Inguinal/mons:  Normal without inguinal adenopathy  External genitalia:  Normal  BUS/Urethra/Skene's glands:  Normal  Vagina:  Normal Moderate foul-smelling menses, wet prep positive for Trichomonas  Cervix:  Normal  Uterus:   normal in size, shape and contour.  Midline and mobile  Adnexa/parametria:     Rt: Without masses or tenderness.   Lt: Without masses or tenderness.  Anus and perineum: Normal  Digital rectal exam: Normal sphincter tone without palpated masses or tenderness  Assessment/Plan:  46 y.o. SBF G3 P2   for annual exam with complaint of discharge and dysmenorrhea.  Monthly 6-7 day cycle/BTL STD screen Trichomonas Smoker  Plan: Flagyl 2 g by mouth for both she and partner. Alcohol precautions reviewed. Instructed to call if no relief of discharge. SBE's, annual screening mammogram, breast center information given instructed to schedule mammogram. Encouraged regular cardio type exercise, calcium rich diet, vitamin D 2000 daily. Condoms encouraged until permanent partner. Smoking cessation tips reviewed. Reviewed blood pressure elevated today instructed to follow-up if greater than 130/80 on recheck. Reviewed hazards of hypertension and smoking. CBC, lipid panel, CMP, Pap with HR HPV typing, GC/Chlamydia, HIV, hep B, C, RPR.     [name] WHNP, 11:11 AM 01/10/2017

## 2017-01-10 NOTE — Patient Instructions (Signed)

## 2017-01-11 LAB — HEPATITIS C ANTIBODY: HCV Ab: NEGATIVE

## 2017-01-11 LAB — HEPATITIS B SURFACE ANTIGEN: HEP B S AG: NEGATIVE

## 2017-01-11 LAB — GC/CHLAMYDIA PROBE AMP
CT Probe RNA: NOT DETECTED
GC PROBE AMP APTIMA: NOT DETECTED

## 2017-01-11 LAB — HIV ANTIBODY (ROUTINE TESTING W REFLEX): HIV 1&2 Ab, 4th Generation: NONREACTIVE

## 2017-01-14 LAB — PAP, TP IMAGING W/ HPV RNA, RFLX HPV TYPE 16,18/45: HPV mRNA, High Risk: NOT DETECTED

## 2018-01-12 ENCOUNTER — Ambulatory Visit (INDEPENDENT_AMBULATORY_CARE_PROVIDER_SITE_OTHER): Payer: No Typology Code available for payment source | Admitting: Women's Health

## 2018-01-12 ENCOUNTER — Encounter: Payer: Self-pay | Admitting: Women's Health

## 2018-01-12 VITALS — BP 124/78 | Ht 66.5 in | Wt 150.4 lb

## 2018-01-12 DIAGNOSIS — N946 Dysmenorrhea, unspecified: Secondary | ICD-10-CM | POA: Diagnosis not present

## 2018-01-12 DIAGNOSIS — Z01419 Encounter for gynecological examination (general) (routine) without abnormal findings: Secondary | ICD-10-CM

## 2018-01-12 DIAGNOSIS — N760 Acute vaginitis: Secondary | ICD-10-CM

## 2018-01-12 DIAGNOSIS — N898 Other specified noninflammatory disorders of vagina: Secondary | ICD-10-CM

## 2018-01-12 DIAGNOSIS — B9689 Other specified bacterial agents as the cause of diseases classified elsewhere: Secondary | ICD-10-CM

## 2018-01-12 LAB — WET PREP FOR TRICH, YEAST, CLUE

## 2018-01-12 MED ORDER — IBUPROFEN 600 MG PO TABS: 600.0000 mg | ORAL_TABLET | Freq: Three times a day (TID) | ORAL | 1 refills | Status: DC | PRN

## 2018-01-12 MED ORDER — ALPRAZOLAM 0.25 MG PO TABS: 0.2500 mg | ORAL_TABLET | Freq: Three times a day (TID) | ORAL | 1 refills | Status: DC

## 2018-01-12 MED ORDER — METRONIDAZOLE 500 MG PO TABS: 500.0000 mg | ORAL_TABLET | Freq: Two times a day (BID) | ORAL | 0 refills | Status: DC

## 2018-01-12 NOTE — Patient Instructions (Signed)
Breast center [phone]   [place] Maintenance, Female Adopting a healthy lifestyle and getting preventive care can go a long way to promote health and wellness. Talk with your health care provider about what schedule of regular examinations is right for you. This is a good chance for you to check in with your provider about disease prevention and staying healthy. In between checkups, there are plenty of things you can do on your own. Experts have done a lot of research about which lifestyle changes and preventive measures are most likely to keep you healthy. Ask your health care provider for more information. Weight and diet Eat a healthy diet  Be sure to include plenty of vegetables, fruits, low-fat dairy products, and lean protein.  Do not eat a lot of foods high in solid fats, added sugars, or salt.  Get regular exercise. This is one of the most important things you can do for your health. ? Most adults should exercise for at least 150 minutes each week. The exercise should increase your heart rate and make you sweat (moderate-intensity exercise). ? Most adults should also do strengthening exercises at least twice a week. This is in addition to the moderate-intensity exercise.  Maintain a healthy weight  Body mass index (BMI) is a measurement that can be used to identify possible weight problems. It estimates body fat based on height and weight. Your health care provider can help determine your BMI and help you achieve or maintain a healthy weight.  For females 69 years of age and older: ? A BMI below 18.5 is considered underweight. ? A BMI of 18.5 to 24.9 is normal. ? A BMI of 25 to 29.9 is considered overweight. ? A BMI of 30 and above is considered obese.  Watch levels of cholesterol and blood lipids  You should start having your blood tested for lipids and cholesterol at 45 years of age, then have this test every 5 years.  You may need to have your  cholesterol levels checked more often if: ? Your lipid or cholesterol levels are high. ? You are older than 45 years of age. ? You are at high risk for heart disease.  Cancer screening Lung Cancer  Lung cancer screening is recommended for adults 67-45 years old who are at high risk for lung cancer because of a history of smoking.  A yearly low-dose CT scan of the lungs is recommended for people who: ? Currently smoke. ? Have quit within the past 15 years. ? Have at least a 30-pack-year history of smoking. A pack year is smoking an average of one pack of cigarettes a day for 1 year.  Yearly screening should continue until it has been 15 years since you quit.  Yearly screening should stop if you develop a health problem that would prevent you from having lung cancer treatment.  Breast Cancer  Practice breast self-awareness. This means understanding how your breasts normally appear and feel.  It also means doing regular breast self-exams. Let your health care provider know about any changes, no matter how small.  If you are in your 45s or 30s, you should have a clinical breast exam (CBE) by a health care provider every 1-3 years as part of a regular health exam.  If you are 45 or older, have a CBE every year. Also consider having a breast X-ray (mammogram) every year.  If you have a family history of breast cancer, talk to your health care  provider about genetic screening.  If you are at high risk for breast cancer, talk to your health care provider about having an MRI and a mammogram every year.  Breast cancer gene (BRCA) assessment is recommended for women who have family members with BRCA-related cancers. BRCA-related cancers include: ? Breast. ? Ovarian. ? Tubal. ? Peritoneal cancers.  Results of the assessment will determine the need for genetic counseling and BRCA1 and BRCA2 testing.  Cervical Cancer Your health care provider may recommend that you be screened regularly  for cancer of the pelvic organs (ovaries, uterus, and vagina). This screening involves a pelvic examination, including checking for microscopic changes to the surface of your cervix (Pap test). You may be encouraged to have this screening done every 3 years, beginning at age 21.  For women ages 30-65, health care providers may recommend pelvic exams and Pap testing every 3 years, or they may recommend the Pap and pelvic exam, combined with testing for human papilloma virus (HPV), every 5 years. Some types of HPV increase your risk of cervical cancer. Testing for HPV may also be done on women of any age with unclear Pap test results.  Other health care providers may not recommend any screening for nonpregnant women who are considered low risk for pelvic cancer and who do not have symptoms. Ask your health care provider if a screening pelvic exam is right for you.  If you have had past treatment for cervical cancer or a condition that could lead to cancer, you need Pap tests and screening for cancer for at least 20 years after your treatment. If Pap tests have been discontinued, your risk factors (such as having a new sexual partner) need to be reassessed to determine if screening should resume. Some women have medical problems that increase the chance of getting cervical cancer. In these cases, your health care provider may recommend more frequent screening and Pap tests.  Colorectal Cancer  This type of cancer can be detected and often prevented.  Routine colorectal cancer screening usually begins at 45 years of age and continues through 45 years of age.  Your health care provider may recommend screening at an earlier age if you have risk factors for colon cancer.  Your health care provider may also recommend using home test kits to check for hidden blood in the stool.  A small camera at the end of a tube can be used to examine your colon directly (sigmoidoscopy or colonoscopy). This is done to  check for the earliest forms of colorectal cancer.  Routine screening usually begins at age 50.  Direct examination of the colon should be repeated every 5-10 years through 45 years of age. However, you may need to be screened more often if early forms of precancerous polyps or small growths are found.  Skin Cancer  Check your skin from head to toe regularly.  Tell your health care provider about any new moles or changes in moles, especially if there is a change in a mole's shape or color.  Also tell your health care provider if you have a mole that is larger than the size of a pencil eraser.  Always use sunscreen. Apply sunscreen liberally and repeatedly throughout the day.  Protect yourself by wearing long sleeves, pants, a wide-brimmed hat, and sunglasses whenever you are outside.  Heart disease, diabetes, and high blood pressure  High blood pressure causes heart disease and increases the risk of stroke. High blood pressure is more likely to develop in: ?   People who have blood pressure in the high end of the normal range (130-139/85-89 mm Hg). ? People who are overweight or obese. ? People who are African American.  If you are 18-39 years of age, have your blood pressure checked every 3-5 years. If you are 40 years of age or older, have your blood pressure checked every year. You should have your blood pressure measured twice-once when you are at a hospital or clinic, and once when you are not at a hospital or clinic. Record the average of the two measurements. To check your blood pressure when you are not at a hospital or clinic, you can use: ? An automated blood pressure machine at a pharmacy. ? A home blood pressure monitor.  If you are between 55 years and 79 years old, ask your health care provider if you should take aspirin to prevent strokes.  Have regular diabetes screenings. This involves taking a blood sample to check your fasting blood sugar level. ? If you are at a  normal weight and have a low risk for diabetes, have this test once every three years after 45 years of age. ? If you are overweight and have a high risk for diabetes, consider being tested at a younger age or more often. Preventing infection Hepatitis B  If you have a higher risk for hepatitis B, you should be screened for this virus. You are considered at high risk for hepatitis B if: ? You were born in a country where hepatitis B is common. Ask your health care provider which countries are considered high risk. ? Your parents were born in a high-risk country, and you have not been immunized against hepatitis B (hepatitis B vaccine). ? You have HIV or AIDS. ? You use needles to inject street drugs. ? You live with someone who has hepatitis B. ? You have had sex with someone who has hepatitis B. ? You get hemodialysis treatment. ? You take certain medicines for conditions, including cancer, organ transplantation, and autoimmune conditions.  Hepatitis C  Blood testing is recommended for: ? Everyone born from 1945 through 1965. ? Anyone with known risk factors for hepatitis C.  Sexually transmitted infections (STIs)  You should be screened for sexually transmitted infections (STIs) including gonorrhea and chlamydia if: ? You are sexually active and are younger than 45 years of age. ? You are older than 45 years of age and your health care provider tells you that you are at risk for this type of infection. ? Your sexual activity has changed since you were last screened and you are at an increased risk for chlamydia or gonorrhea. Ask your health care provider if you are at risk.  If you do not have HIV, but are at risk, it may be recommended that you take a prescription medicine daily to prevent HIV infection. This is called pre-exposure prophylaxis (PrEP). You are considered at risk if: ? You are sexually active and do not regularly use condoms or know the HIV status of your  partner(s). ? You take drugs by injection. ? You are sexually active with a partner who has HIV.  Talk with your health care provider about whether you are at high risk of being infected with HIV. If you choose to begin PrEP, you should first be tested for HIV. You should then be tested every 3 months for as long as you are taking PrEP. Pregnancy  If you are premenopausal and you may become pregnant, ask your health   care provider about preconception counseling.  If you may become pregnant, take 400 to 800 micrograms (mcg) of folic acid every day.  If you want to prevent pregnancy, talk to your health care provider about birth control (contraception). Osteoporosis and menopause  Osteoporosis is a disease in which the bones lose minerals and strength with aging. This can result in serious bone fractures. Your risk for osteoporosis can be identified using a bone density scan.  If you are 2 years of age or older, or if you are at risk for osteoporosis and fractures, ask your health care provider if you should be screened.  Ask your health care provider whether you should take a calcium or vitamin D supplement to lower your risk for osteoporosis.  Menopause may have certain physical symptoms and risks.  Hormone replacement therapy may reduce some of these symptoms and risks. Talk to your health care provider about whether hormone replacement therapy is right for you. Follow these instructions at home:  Schedule regular health, dental, and eye exams.  Stay current with your immunizations.  Do not use any tobacco products including cigarettes, chewing tobacco, or electronic cigarettes.  If you are pregnant, do not drink alcohol.  If you are breastfeeding, limit how much and how often you drink alcohol.  Limit alcohol intake to no more than 1 drink per day for nonpregnant women. One drink equals 12 ounces of beer, 5 ounces of wine, or 1 ounces of hard liquor.  Do not use street  drugs.  Do not share needles.  Ask your health care provider for help if you need support or information about quitting drugs.  Tell your health care provider if you often feel depressed.  Tell your health care provider if you have ever been abused or do not feel safe at home. This information is not intended to replace advice given to you by your health care provider. Make sure you discuss any questions you have with your health care provider. Document Released: 06/10/2011 Document Revised: 05/02/2016 Document Reviewed: 08/29/2015 Elsevier Interactive Patient Education  2018 [place] with Quitting Smoking Quitting smoking is a physical and mental challenge. You will face cravings, withdrawal symptoms, and temptation. Before quitting, work with your health care provider to make a plan that can help you cope. Preparation can help you quit and keep you from giving in. How can I cope with cravings? Cravings usually last for 5-10 minutes. If you get through it, the craving will pass. Consider taking the following actions to help you cope with cravings:  Keep your mouth busy: ? Chew sugar-free gum. ? Suck on hard candies or a straw. ? Brush your teeth.  Keep your hands and body busy: ? Immediately change to a different activity when you feel a craving. ? Squeeze or play with a ball. ? Do an activity or a hobby, like making bead jewelry, practicing needlepoint, or working with wood. ? Mix up your normal routine. ? Take a short exercise break. Go for a quick walk or run up and down stairs. ? Spend time in public places where smoking is not allowed.  Focus on doing something kind or helpful for someone else.  Call a friend or family member to talk during a craving.  Join a support group.  Call a quit line, such as 1-800-QUIT-NOW.  Talk with your health care provider about medicines that might help you cope with cravings and make quitting easier for you.  How can I deal  with withdrawal symptoms? Your body may experience negative effects as it tries to get used to not having nicotine in the system. These effects are called withdrawal symptoms. They may include:  Feeling hungrier than normal.  Trouble concentrating.  Irritability.  Trouble sleeping.  Feeling depressed.  Restlessness and agitation.  Craving a cigarette.  To manage withdrawal symptoms:  Avoid places, people, and activities that trigger your cravings.  Remember why you want to quit.  Get plenty of sleep.  Avoid coffee and other caffeinated drinks. These may worsen some of your symptoms.  How can I handle social situations? Social situations can be difficult when you are quitting smoking, especially in the first few weeks. To manage this, you can:  Avoid parties, bars, and other social situations where people might be smoking.  Avoid alcohol.  Leave right away if you have the urge to smoke.  Explain to your family and friends that you are quitting smoking. Ask for understanding and support.  Plan activities with friends or family where smoking is not an option.  What are some ways I can cope with stress? Wanting to smoke may cause stress, and stress can make you want to smoke. Find ways to manage your stress. Relaxation techniques can help. For example:  Breathe slowly and deeply, in through your nose and out through your mouth.  Listen to soothing, relaxing music.  Talk with a family member or friend about your stress.  Light a candle.  Soak in a bath or take a shower.  Think about a peaceful place.  What are some ways I can prevent weight gain? Be aware that many people gain weight after they quit smoking. However, not everyone does. To keep from gaining weight, have a plan in place before you quit and stick to the plan after you quit. Your plan should include:  Having healthy snacks. When you have a craving, it may help to: ? Eat plain popcorn, crunchy  carrots, celery, or other cut vegetables. ? Chew sugar-free gum.  Changing how you eat: ? Eat small portion sizes at meals. ? Eat 4-6 small meals throughout the day instead of 1-2 large meals a day. ? Be mindful when you eat. Do not watch television or do other things that might distract you as you eat.  Exercising regularly: ? Make time to exercise each day. If you do not have time for a long workout, do short bouts of exercise for 5-10 minutes several times a day. ? Do some form of strengthening exercise, like weight lifting, and some form of aerobic exercise, like running or swimming.  Drinking plenty of water or other low-calorie or no-calorie drinks. Drink 6-8 glasses of water daily, or as much as instructed by your health care provider.  Summary  Quitting smoking is a physical and mental challenge. You will face cravings, withdrawal symptoms, and temptation to smoke again. Preparation can help you as you go through these challenges.  You can cope with cravings by keeping your mouth busy (such as by chewing gum), keeping your body and hands busy, and making calls to family, friends, or a helpline for people who want to quit smoking.  You can cope with withdrawal symptoms by avoiding places where people smoke, avoiding drinks with caffeine, and getting plenty of rest.  Ask your health care provider about the different ways to prevent weight gain, avoid stress, and handle social situations. This information is not intended to replace advice given to you by your health care  provider. Make sure you discuss any questions you have with your health care provider. Document Released: 11/22/2016 Document Revised: 11/22/2016 Document Reviewed: 11/22/2016 Elsevier Interactive Patient Education  [employer].

## 2018-01-12 NOTE — Progress Notes (Signed)
Whitney Gay 10-27-1971 [id number]    History:    Presents for annual exam.  Monthly 6 day cycle/BTL. Same partner with negative STD screen. 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 Smoker in the process of quitting. Normal Pap history.  Past medical history, past surgical history, family history and social history were all reviewed and documented in the EPIC chart. Desk job. Daughter has 3 children 2 have been placed for adoption has one doing well with. Son currently incarcerated due to be out in June. Has 7 siblings.  ROS:  A ROS was performed and pertinent positives and negatives are included.  Exam:  Vitals:   01/12/18 0958  BP: 124/78  Weight: 150 lb 6.4 oz (68.2 kg)  Height: 5' 6.5" (1.689 m)   Body mass index is 23.91 kg/m.   General appearance:  Normal Thyroid:  Symmetrical, normal in size, without palpable masses or nodularity. Respiratory  Auscultation:  Clear without wheezing or rhonchi Cardiovascular  Auscultation:  Regular rate, without rubs, murmurs or gallops  Edema/varicosities:  Not grossly evident Abdominal  Soft,nontender, without masses, guarding or rebound.  Liver/spleen:  No organomegaly noted  Hernia:  None appreciated  Skin  Inspection:  Grossly normal   Breasts: Examined lying and sitting.     Right: Without masses, retractions, discharge or axillary adenopathy.     Left: Without masses, retractions, discharge or axillary adenopathy. Gentitourinary   Inguinal/mons:  Normal without inguinal adenopathy  External genitalia:  Normal  BUS/Urethra/Skene's glands:  Normal  Vagina:  White adherent discharge with odor, wet prep positive for clues, TNTC bacteria Cervix:  Normal  Uterus:   normal in size, shape and contour.  Midline and mobile  Adnexa/parametria:     Rt: Without masses or tenderness.   Lt: Without masses or tenderness.  Anus and perineum: Normal  Digital rectal exam: Normal sphincter tone without palpated masses or  tenderness  Assessment/Plan:  47 y.o. SBF G3 P2 for annual exam with complaint of vaginal discharge with odor.  Monthly 6 day cycle/BTL Bacteria vaginosis Smoker  Plan: Flagyl 500 twice daily for 7 days, alcohol precautions reviewed. SBE's, reviewed importance of annual screening mammogram, breast center information given instructed to schedule. Tips to quit smoking reviewed, reviewed importance of increasing exercise, limiting snacks encouraged. Calcium rich foods, vitamin D 1000 daily encouraged. Xanax 0.25 as needed, reviewed addictive properties, uses rarely. CBC, CMP, Pap normal 2018, new screening guidelines reviewed.[name] [facility], 10:26 AM 01/12/2018

## 2018-03-09 ENCOUNTER — Other Ambulatory Visit: Payer: Self-pay | Admitting: Women's Health

## 2018-03-09 DIAGNOSIS — Z1239 Encounter for other screening for malignant neoplasm of breast: Secondary | ICD-10-CM

## 2018-03-26 ENCOUNTER — Ambulatory Visit
Admission: RE | Admit: 2018-03-26 | Discharge: 2018-03-26 | Disposition: A | Discharge status: Home / Self Care | Payer: PRIVATE HEALTH INSURANCE | Source: Ambulatory Visit | Attending: Women's Health | Admitting: Women's Health

## 2018-03-26 DIAGNOSIS — Z1239 Encounter for other screening for malignant neoplasm of breast: Secondary | ICD-10-CM

## 2018-11-16 ENCOUNTER — Other Ambulatory Visit: Payer: Self-pay | Admitting: *Deleted

## 2018-11-16 MED ORDER — ALPRAZOLAM 0.25 MG PO TABS: 0.2500 mg | ORAL_TABLET | Freq: Three times a day (TID) | ORAL | 0 refills | Status: DC

## 2018-11-16 NOTE — Telephone Encounter (Signed)
Patient called with pharmacy [facility] [place]. I called it to the one in her chart [facility] [address] [facility] [place].

## 2018-11-16 NOTE — Telephone Encounter (Signed)
Patient called requesting refill on Xanax 0.25 mg tablet, last filled on 01/12/18 with 1 refill.

## 2018-11-16 NOTE — Telephone Encounter (Signed)
Left message for patient to call to find out what pharmacy.

## 2018-11-16 NOTE — Telephone Encounter (Signed)
Okay for refill?  

## 2019-01-13 ENCOUNTER — Encounter: Payer: No Typology Code available for payment source | Admitting: Women's Health

## 2019-01-13 DIAGNOSIS — Z0289 Encounter for other administrative examinations: Secondary | ICD-10-CM

## 2019-03-08 ENCOUNTER — Other Ambulatory Visit: Payer: Self-pay

## 2019-03-10 ENCOUNTER — Ambulatory Visit (INDEPENDENT_AMBULATORY_CARE_PROVIDER_SITE_OTHER): Payer: 59 | Admitting: Women's Health

## 2019-03-10 ENCOUNTER — Encounter: Payer: Self-pay | Admitting: Women's Health

## 2019-03-10 ENCOUNTER — Other Ambulatory Visit: Payer: Self-pay

## 2019-03-10 ENCOUNTER — Telehealth: Payer: Self-pay | Admitting: *Deleted

## 2019-03-10 VITALS — BP 120/70 | Ht 67.0 in | Wt 128.0 lb

## 2019-03-10 DIAGNOSIS — B9689 Other specified bacterial agents as the cause of diseases classified elsewhere: Secondary | ICD-10-CM

## 2019-03-10 DIAGNOSIS — Z01419 Encounter for gynecological examination (general) (routine) without abnormal findings: Secondary | ICD-10-CM | POA: Diagnosis not present

## 2019-03-10 DIAGNOSIS — Z13220 Encounter for screening for lipoid disorders: Secondary | ICD-10-CM | POA: Diagnosis not present

## 2019-03-10 DIAGNOSIS — N63 Unspecified lump in unspecified breast: Secondary | ICD-10-CM

## 2019-03-10 DIAGNOSIS — N760 Acute vaginitis: Secondary | ICD-10-CM

## 2019-03-10 DIAGNOSIS — N898 Other specified noninflammatory disorders of vagina: Secondary | ICD-10-CM | POA: Diagnosis not present

## 2019-03-10 LAB — COMPREHENSIVE METABOLIC PANEL
AG Ratio: 1.6 (calc) (ref 1.0–2.5)
ALT: 9 U/L (ref 6–29)
AST: 14 U/L (ref 10–35)
Albumin: 4.1 g/dL (ref 3.6–5.1)
Alkaline phosphatase (APISO): 62 U/L (ref 31–125)
BUN: 8 mg/dL (ref 7–25)
CO2: 27 mmol/L (ref 20–32)
Calcium: 8.9 mg/dL (ref 8.6–10.2)
Chloride: 107 mmol/L (ref 98–110)
Creat: 0.57 mg/dL (ref 0.50–1.10)
Globulin: 2.6 g/dL (calc) (ref 1.9–3.7)
Glucose, Bld: 85 mg/dL (ref 65–99)
Potassium: 3.9 mmol/L (ref 3.5–5.3)
Sodium: 140 mmol/L (ref 135–146)
Total Bilirubin: 0.4 mg/dL (ref 0.2–1.2)
Total Protein: 6.7 g/dL (ref 6.1–8.1)

## 2019-03-10 LAB — CBC WITH DIFFERENTIAL/PLATELET
Absolute Monocytes: 442 cells/uL (ref 200–950)
Basophils Absolute: 27 cells/uL (ref 0–200)
Basophils Relative: 0.4 %
Eosinophils Absolute: 221 cells/uL (ref 15–500)
Eosinophils Relative: 3.3 %
HCT: 40.5 % (ref 35.0–45.0)
Hemoglobin: 13.4 g/dL (ref 11.7–15.5)
Lymphs Abs: 2064 cells/uL (ref 850–3900)
MCH: 28 pg (ref 27.0–33.0)
MCHC: 33.1 g/dL (ref 32.0–36.0)
MCV: 84.7 fL (ref 80.0–100.0)
MPV: 11.7 fL (ref 7.5–12.5)
Monocytes Relative: 6.6 %
Neutro Abs: 3946 cells/uL (ref 1500–7800)
Neutrophils Relative %: 58.9 %
Platelets: 255 10*3/uL (ref 140–400)
RBC: 4.78 10*6/uL (ref 3.80–5.10)
RDW: 13.6 % (ref 11.0–15.0)
Total Lymphocyte: 30.8 %
WBC: 6.7 10*3/uL (ref 3.8–10.8)

## 2019-03-10 LAB — LIPID PANEL
Cholesterol: 163 mg/dL (ref <200)
HDL: 54 mg/dL (ref >=50)
LDL Cholesterol (Calc): 94 mg/dL (calc)
Non-HDL Cholesterol (Calc): 109 mg/dL (calc) (ref <130)
Total CHOL/HDL Ratio: 3 (calc) (ref <5.0)
Triglycerides: 68 mg/dL (ref <150)

## 2019-03-10 LAB — WET PREP FOR TRICH, YEAST, CLUE

## 2019-03-10 MED ORDER — METRONIDAZOLE 500 MG PO TABS: 500.0000 mg | ORAL_TABLET | Freq: Two times a day (BID) | ORAL | 0 refills | Status: DC

## 2019-03-10 NOTE — Progress Notes (Signed)
Whitney Gay January 03, 1971 [id number]    History:    Presents for annual exam.  Monthly cycle/BTL.  Normal Pap and mammogram history.  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  Smoker.  Same partner, denies need for STD screen.  Past medical history, past surgical history, family history and social history were all reviewed and documented in the EPIC chart.  Has 2 children son is incarcerated.  Daughter has 3 children 2 have been placed for adoption and she has 1 child living with her.  Was homeless for 6 months last year, doing better now working.  ROS:  A ROS was performed and pertinent positives and negatives are included.  Exam:  Vitals:   03/10/19 0826  BP: 120/70  Weight: 128 lb (58.1 kg)  Height: 5\' 7"  (1.702 m)   Body mass index is 20.05 kg/m.   General appearance:  Normal Thyroid:  Symmetrical, normal in size, without palpable masses or nodularity. Respiratory  Auscultation:  Clear without wheezing or rhonchi Cardiovascular  Auscultation:  Regular rate, without rubs, murmurs or gallops  Edema/varicosities:  Not grossly evident Abdominal  Soft,nontender, without masses, guarding or rebound.  Liver/spleen:  No organomegaly noted  Hernia:  None appreciated  Skin  Inspection:  Grossly normal   Breasts: Examined lying and sitting.     Right: Stable fibroadenoma 2 o'clock position without masses, retractions, discharge or axillary adenopathy.     Left: 2 cm mobile nontender nodule at 10 o'clock position by areola discharge or axillary adenopathy. Gentitourinary   Inguinal/mons:  Normal without inguinal adenopathy  External genitalia:  Normal  BUS/Urethra/Skene's glands:  Normal  Vagina:  Normal scant menses, wet prep positive for clues, TNTC bacteria  Cervix:  Normal  Uterus:   normal in size, shape and contour.  Midline and mobile  Adnexa/parametria:     Rt: Without masses or tenderness.   Lt: Without masses or tenderness.  Anus and perineum: Normal  Digital rectal  exam: Normal sphincter tone without palpated masses or tenderness  Assessment/Plan:  48 y.o. SBF G3 P2 for annual exam with complaint of vaginal discharge with odor.  Monthly cycle/BTL Bacterial vaginosis Left 2 cm nodule 10 o'clock position-new Right breast fibroadenoma stable  Plan: We will get scheduled diagnostic mammogram/ultrasound left breast.  Reviewed most likely fibroadenoma.  Continue SBEs, exercise, calcium rich foods, MVI daily with iron in it daily, history of anemia.  Increase dark green leafy vegetables encouraged.  Aware of hazards of smoking tips to quit reviewed.  Flagyl 500 twice daily for 7 days, alcohol precautions reviewed.  Instructed to call if continued problems.  CBC, CMP, lipid panel, UA.  Pap normal 2018, new screening guidelines reviewed.    [place], 8:33 AM 03/10/2019

## 2019-03-10 NOTE — Telephone Encounter (Signed)
Patient scheduled on 03/22/19 @ 7:20am at breast center for the below only.  Patient aware.

## 2019-03-10 NOTE — Patient Instructions (Signed)

## 2019-03-10 NOTE — Telephone Encounter (Signed)
-----   Message from [name], NP sent at 03/10/2019  8:39 AM EDT ----- Needs diag mammogram with [place] lt breast mobile non tender smooth 2 cm nodule at 10 by areala.  Needs early am, works at 10 am if possible

## 2019-03-22 ENCOUNTER — Other Ambulatory Visit: Payer: Self-pay | Admitting: Women's Health

## 2019-03-22 ENCOUNTER — Other Ambulatory Visit: Payer: Self-pay

## 2019-03-22 ENCOUNTER — Ambulatory Visit
Admission: RE | Admit: 2019-03-22 | Discharge: 2019-03-22 | Disposition: A | Discharge status: Home / Self Care | Payer: PRIVATE HEALTH INSURANCE | Source: Ambulatory Visit | Attending: Women's Health | Admitting: Women's Health

## 2019-03-22 ENCOUNTER — Ambulatory Visit: Payer: PRIVATE HEALTH INSURANCE

## 2019-03-22 DIAGNOSIS — N63 Unspecified lump in unspecified breast: Secondary | ICD-10-CM

## 2019-04-02 ENCOUNTER — Telehealth: Payer: Self-pay | Admitting: *Deleted

## 2019-04-02 DIAGNOSIS — N946 Dysmenorrhea, unspecified: Secondary | ICD-10-CM

## 2019-04-02 MED ORDER — IBUPROFEN 600 MG PO TABS: 600.0000 mg | ORAL_TABLET | Freq: Three times a day (TID) | ORAL | 0 refills | Status: DC | PRN

## 2019-04-02 NOTE — Telephone Encounter (Signed)
Patient called requesting refill on motrin 600 mg tablets, takes for menstrual cramps. Please advise

## 2019-04-02 NOTE — Telephone Encounter (Signed)
Patient aware, Rx sent.  

## 2019-04-02 NOTE — Telephone Encounter (Signed)
Ok for refill? 

## 2019-07-16 ENCOUNTER — Other Ambulatory Visit: Payer: Self-pay

## 2019-07-16 DIAGNOSIS — N946 Dysmenorrhea, unspecified: Secondary | ICD-10-CM

## 2019-07-16 MED ORDER — IBUPROFEN 600 MG PO TABS: 600.0000 mg | ORAL_TABLET | Freq: Three times a day (TID) | ORAL | 0 refills | Status: DC | PRN

## 2019-07-16 NOTE — Telephone Encounter (Signed)
Thank you :)

## 2019-07-16 NOTE — Telephone Encounter (Signed)
Whitney Gay is off. Patient last got #60 Ibuprofen in April and is current with [facility]. I went ahead and refilled it. Patient informed.

## 2019-07-16 NOTE — Telephone Encounter (Signed)
Calling for refill on Ibuprofen. Is having menstrual cramps.

## 2019-11-30 ENCOUNTER — Telehealth: Payer: Self-pay | Admitting: *Deleted

## 2019-11-30 DIAGNOSIS — N946 Dysmenorrhea, unspecified: Secondary | ICD-10-CM

## 2019-11-30 MED ORDER — IBUPROFEN 600 MG PO TABS: 600.0000 mg | ORAL_TABLET | Freq: Three times a day (TID) | ORAL | 0 refills | Status: DC | PRN

## 2019-11-30 NOTE — Telephone Encounter (Signed)
Patient called requesting refill on ibuprofen 600 mg tablets, takes for menstrual cramps. Please advise

## 2019-11-30 NOTE — Telephone Encounter (Signed)
Rx sent, patient aware.

## 2019-11-30 NOTE — Telephone Encounter (Signed)
Okay for refill?  

## 2020-01-24 ENCOUNTER — Encounter: Payer: Self-pay | Admitting: *Deleted

## 2020-01-24 ENCOUNTER — Telehealth: Payer: Self-pay | Admitting: *Deleted

## 2020-01-24 NOTE — Telephone Encounter (Signed)
Patient informed ,letter left at appointment desk for pick up.

## 2020-01-24 NOTE — Telephone Encounter (Signed)
Please write a letter stating that she has seasonal allergies which cause occasional runny nose, watery eyes, and sneezing.  The symptoms are not due to viral illness.  Thank you

## 2020-01-24 NOTE — Telephone Encounter (Signed)
Patient called asking if you could provider a letter for work, has seasonal allergies running nose and watery eyes, her job requesting a note stating she has allergies. Her job made her go have Covid testing which was negative has paperwork and did show to her manager,however they still would like a note stating she has allergies. Patient said you are her PCP. Please advise

## 2020-03-15 ENCOUNTER — Encounter: Payer: 59 | Admitting: Women's Health

## 2020-04-04 ENCOUNTER — Encounter: Payer: Self-pay | Admitting: Women's Health

## 2020-04-04 ENCOUNTER — Ambulatory Visit: Payer: 59 | Admitting: Women's Health

## 2020-04-04 ENCOUNTER — Other Ambulatory Visit: Payer: Self-pay

## 2020-04-04 VITALS — BP 128/80 | Ht 67.0 in | Wt 120.0 lb

## 2020-04-04 DIAGNOSIS — N898 Other specified noninflammatory disorders of vagina: Secondary | ICD-10-CM | POA: Diagnosis not present

## 2020-04-04 DIAGNOSIS — Z01419 Encounter for gynecological examination (general) (routine) without abnormal findings: Secondary | ICD-10-CM

## 2020-04-04 DIAGNOSIS — Z13220 Encounter for screening for lipoid disorders: Secondary | ICD-10-CM

## 2020-04-04 LAB — CBC WITH DIFFERENTIAL/PLATELET
Absolute Monocytes: 484 cells/uL (ref 200–950)
Basophils Absolute: 31 cells/uL (ref 0–200)
Basophils Relative: 0.5 %
Eosinophils Absolute: 260 cells/uL (ref 15–500)
Eosinophils Relative: 4.2 %
HCT: 42.6 % (ref 35.0–45.0)
Hemoglobin: 13.7 g/dL (ref 11.7–15.5)
Lymphs Abs: 2213 cells/uL (ref 850–3900)
MCH: 27.3 pg (ref 27.0–33.0)
MCHC: 32.2 g/dL (ref 32.0–36.0)
MCV: 85 fL (ref 80.0–100.0)
MPV: 11.9 fL (ref 7.5–12.5)
Monocytes Relative: 7.8 %
Neutro Abs: 3212 cells/uL (ref 1500–7800)
Neutrophils Relative %: 51.8 %
Platelets: 207 10*3/uL (ref 140–400)
RBC: 5.01 10*6/uL (ref 3.80–5.10)
RDW: 13.4 % (ref 11.0–15.0)
Total Lymphocyte: 35.7 %
WBC: 6.2 10*3/uL (ref 3.8–10.8)

## 2020-04-04 LAB — COMPREHENSIVE METABOLIC PANEL
AG Ratio: 1.9 (calc) (ref 1.0–2.5)
ALT: 8 U/L (ref 6–29)
AST: 12 U/L (ref 10–35)
Albumin: 4.3 g/dL (ref 3.6–5.1)
Alkaline phosphatase (APISO): 61 U/L (ref 31–125)
BUN: 7 mg/dL (ref 7–25)
CO2: 26 mmol/L (ref 20–32)
Calcium: 9.3 mg/dL (ref 8.6–10.2)
Chloride: 107 mmol/L (ref 98–110)
Creat: 0.63 mg/dL (ref 0.50–1.10)
Globulin: 2.3 g/dL (calc) (ref 1.9–3.7)
Glucose, Bld: 97 mg/dL (ref 65–99)
Potassium: 3.6 mmol/L (ref 3.5–5.3)
Sodium: 139 mmol/L (ref 135–146)
Total Bilirubin: 0.7 mg/dL (ref 0.2–1.2)
Total Protein: 6.6 g/dL (ref 6.1–8.1)

## 2020-04-04 LAB — LIPID PANEL
Cholesterol: 179 mg/dL (ref <200)
HDL: 54 mg/dL (ref >=50)
LDL Cholesterol (Calc): 105 mg/dL (calc) — ABNORMAL HIGH
Non-HDL Cholesterol (Calc): 125 mg/dL (calc) (ref <130)
Total CHOL/HDL Ratio: 3.3 (calc) (ref <5.0)
Triglycerides: 100 mg/dL (ref <150)

## 2020-04-04 MED ORDER — METRONIDAZOLE 500 MG PO TABS: 500.0000 mg | ORAL_TABLET | Freq: Two times a day (BID) | ORAL | 0 refills | Status: DC

## 2020-04-04 MED ORDER — ALPRAZOLAM 0.25 MG PO TABS: 0.2500 mg | ORAL_TABLET | Freq: Three times a day (TID) | ORAL | 1 refills | Status: DC

## 2020-04-04 NOTE — Patient Instructions (Addendum)
It has been a pleasure knowing you Vitamin D 1000 IUs daily Health Maintenance Bacterial Vaginosis  Bacterial vaginosis is an infection of the vagina. It happens when too many normal germs (healthy bacteria) grow in the vagina. This infection puts you at risk for infections from sex (STIs). Treating this infection can lower your risk for some STIs. You should also treat this if you are pregnant. It can cause your baby to be born early. Follow these instructions at home: Medicines  Take over-the-counter and prescription medicines only as told by your doctor.  Take or use your antibiotic medicine as told by your doctor. Do not stop taking or using it even if you start to feel better. General instructions  If you your sexual partner is a woman, tell her that you have this infection. She needs to get treatment if she has symptoms. If you have a female partner, he does not need to be treated.  During treatment: ? Avoid sex. ? Do not douche. ? Avoid alcohol as told. ? Avoid breastfeeding as told.  Drink enough fluid to keep your pee (urine) clear or pale yellow.  Keep your vagina and butt (rectum) clean. ? Wash the area with warm water every day. ? Wipe from front to back after you use the toilet.  Keep all follow-up visits as told by your doctor. This is important. Preventing this condition  Do not douche.  Use only warm water to wash around your vagina.  Use protection when you have sex. This includes: ? Latex condoms. ? Dental dams.  Limit how many people you have sex with. It is best to only have sex with the same person (be monogamous).  Get tested for STIs. Have your partner get tested.  Wear underwear that is cotton or lined with cotton.  Avoid tight pants and pantyhose. This is most important in summer.  Do not use any products that have nicotine or tobacco in them. These include cigarettes and e-cigarettes. If you need help quitting, ask your doctor.  Do not use  illegal drugs.  Limit how much alcohol you drink. Contact a doctor if:  Your symptoms do not get better, even after you are treated.  You have more discharge or pain when you pee (urinate).  You have a fever.  You have pain in your belly (abdomen).  You have pain with sex.  Your bleed from your vagina between periods. Summary  This infection happens when too many germs (bacteria) grow in the vagina.  Treating this condition can lower your risk for some infections from sex (STIs).  You should also treat this if you are pregnant. It can cause early (premature) birth.  Do not stop taking or using your antibiotic medicine even if you start to feel better. This information is not intended to replace advice given to you by your health care provider. Make sure you discuss any questions you have with your health care provider. Document Revised: 11/07/2017 Document Reviewed: 08/10/2016 Elsevier Patient Education  2020 [employer]. , Female Adopting a healthy lifestyle and getting preventive care are important in promoting health and wellness. Ask your health care provider about:  The right schedule for you to have regular tests and exams.  Things you can do on your own to prevent diseases and keep yourself healthy. What should I know about diet, weight, and exercise? Eat a healthy diet   Eat a diet that includes plenty of vegetables, fruits, low-fat dairy products, and lean protein.  Do  not eat a lot of foods that are high in solid fats, added sugars, or sodium. Maintain a healthy weight Body mass index (BMI) is used to identify weight problems. It estimates body fat based on height and weight. Your health care provider can help determine your BMI and help you achieve or maintain a healthy weight. Get regular exercise Get regular exercise. This is one of the most important things you can do for your health. Most adults should:  Exercise for at least 150 minutes each week. The  exercise should increase your heart rate and make you sweat (moderate-intensity exercise).  Do strengthening exercises at least twice a week. This is in addition to the moderate-intensity exercise.  Spend less time sitting. Even light physical activity can be beneficial. Watch cholesterol and blood lipids Have your blood tested for lipids and cholesterol at 47 years of age, then have this test every 5 years. Have your cholesterol levels checked more often if:  Your lipid or cholesterol levels are high.  You are older than 47 years of age.  You are at high risk for heart disease. What should I know about cancer screening? Depending on your health history and family history, you may need to have cancer screening at various ages. This may include screening for:  Breast cancer.  Cervical cancer.  Colorectal cancer.  Skin cancer.  Lung cancer. What should I know about heart disease, diabetes, and high blood pressure? Blood pressure and heart disease  High blood pressure causes heart disease and increases the risk of stroke. This is more likely to develop in people who have high blood pressure readings, are of African descent, or are overweight.  Have your blood pressure checked: ? Every 3-5 years if you are 81-78 years of age. ? Every year if you are 40 years old or older. Diabetes Have regular diabetes screenings. This checks your fasting blood sugar level. Have the screening done:  Once every three years after age 61 if you are at a normal weight and have a low risk for diabetes.  More often and at a younger age if you are overweight or have a high risk for diabetes. What should I know about preventing infection? Hepatitis B If you have a higher risk for hepatitis B, you should be screened for this virus. Talk with your health care provider to find out if you are at risk for hepatitis B infection. Hepatitis C Testing is recommended for:  Everyone born from 18 through  1965.  Anyone with known risk factors for hepatitis C. Sexually transmitted infections (STIs)  Get screened for STIs, including gonorrhea and chlamydia, if: ? You are sexually active and are younger than 47 years of age. ? You are older than 47 years of age and your health care provider tells you that you are at risk for this type of infection. ? Your sexual activity has changed since you were last screened, and you are at increased risk for chlamydia or gonorrhea. Ask your health care provider if you are at risk.  Ask your health care provider about whether you are at high risk for HIV. Your health care provider may recommend a prescription medicine to help prevent HIV infection. If you choose to take medicine to prevent HIV, you should first get tested for HIV. You should then be tested every 3 months for as long as you are taking the medicine. Pregnancy  If you are about to stop having your period (premenopausal) and you may become  pregnant, seek counseling before you get pregnant.  Take 400 to 800 micrograms (mcg) of folic acid every day if you become pregnant.  Ask for birth control (contraception) if you want to prevent pregnancy. Osteoporosis and menopause Osteoporosis is a disease in which the bones lose minerals and strength with aging. This can result in bone fractures. If you are 30 years old or older, or if you are at risk for osteoporosis and fractures, ask your health care provider if you should:  Be screened for bone loss.  Take a calcium or vitamin D supplement to lower your risk of fractures.  Be given hormone replacement therapy (HRT) to treat symptoms of menopause. Follow these instructions at home: Lifestyle  Do not use any products that contain nicotine or tobacco, such as cigarettes, e-cigarettes, and chewing tobacco. If you need help quitting, ask your health care provider.  Do not use street drugs.  Do not share needles.  Ask your health care provider for  help if you need support or information about quitting drugs. Alcohol use  Do not drink alcohol if: ? Your health care provider tells you not to drink. ? You are pregnant, may be pregnant, or are planning to become pregnant.  If you drink alcohol: ? Limit how much you use to 0-1 drink a day. ? Limit intake if you are breastfeeding.  Be aware of how much alcohol is in your drink. In the [place] one drink equals one 12 oz bottle of beer (355 mL), one 5 oz glass of wine (148 mL), or one 1 oz glass of hard liquor (44 mL). General instructions  Schedule regular health, dental, and eye exams.  Stay current with your vaccines.  Tell your health care provider if: ? You often feel depressed. ? You have ever been abused or do not feel safe at home. Summary  Adopting a healthy lifestyle and getting preventive care are important in promoting health and wellness.  Follow your health care provider's instructions about healthy diet, exercising, and getting tested or screened for diseases.  Follow your health care provider's instructions on monitoring your cholesterol and blood pressure. This information is not intended to replace advice given to you by your health care provider. Make sure you discuss any questions you have with your health care provider. Document Revised: 11/18/2018 Document Reviewed: 11/18/2018 Elsevier Patient Education  2020 [employer].

## 2020-04-04 NOTE — Progress Notes (Signed)
   Whitney Gay [birth year] [id number]   History:  49 y.o. presents for annual exam.  Monthly cycle/BTL not sexually active denies need for STD screen.  Normal Pap and mammogram history.  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  History of anxiety/depression rare Xanax use.  Gynecologic History Patient's last menstrual period was 03/01/2020. Period Cycle (Days): 28 Period Duration (Days): 5 Period Pattern: Regular Menstrual Flow: Moderate Menstrual Control: Maxi pad, Tampon Dysmenorrhea: (!) Mild Dysmenorrhea Symptoms: Cramping  Past medical history, past surgical history, family history and social history were all reviewed and documented in the EPIC chart.  Works for [employer].  Daughter has 3 children and has only custody of 1.  Son is out of jail doing much better working and taking online classes.  ROS:  A ROS was performed and pertinent positives and negatives are included.  Exam:  Vitals:   04/04/20 0926  BP: 128/80  Weight: 120 lb (54.4 kg)  Height: 5\' 7"  (1.702 m)   Body mass index is 18.79 kg/m.  General appearance:  Normal Thyroid:  Symmetrical, normal in size, without palpable masses or nodularity. Respiratory  Auscultation:  Clear without wheezing or rhonchi Cardiovascular  Auscultation:  Regular rate, without rubs, murmurs or gallops  Edema/varicosities:  Not grossly evident Abdominal  Soft,nontender, without masses, guarding or rebound.  Liver/spleen:  No organomegaly noted  Hernia:  None appreciated  Skin  Inspection:  Grossly normal   Breasts: Examined lying and sitting.   Right: 3 cm mobile nontender fibroadenoma at 10 o'clock position   Left: Without masses, retractions, discharge or axillary adenopathy. Gentitourinary   Inguinal/mons:  Normal without inguinal adenopathy  External genitalia:  Normal  BUS/Urethra/Skene's glands:  Normal  Vagina: White adherent discharge, wet prep positive for clues, TNTC bacteria   Cervix:  Normal  Uterus:    normal in size, shape and contour.  Midline and mobile  Adnexa/parametria:     Rt: Without masses or tenderness.   Lt: Without masses or tenderness.  Anus and perineum: Normal  Digital rectal exam: Normal sphincter tone without palpated masses or tenderness  Assessment/Plan:  49 y.o. S BF G3, P2 for annual exam with complaint of vaginal discharge with odor at times.  Denies urinary symptoms, abdominal pain or fever.  Monthly cycle/BTL not sexually active 2008 right breast fibroadenoma unchanged  Smoker  Plan: Aware of hazards of smoking currently using NicoDerm patch to help quit.  SBEs, continue annual 3D screening mammogram due this month instructed to schedule.  Continue active lifestyle, cardio type and balance type exercise , calcium rich foods, vitamin D 1000 IUs daily encouraged.  CBC, CMP, lipid panel, Pap with HR HPV typing, new screening guidelines reviewed.     [place], 9:35 AM 04/04/2020

## 2020-04-05 LAB — PAP, TP IMAGING W/ HPV RNA, RFLX HPV TYPE 16,18/45: HPV DNA High Risk: NOT DETECTED

## 2020-04-05 LAB — WET PREP FOR TRICH, YEAST, CLUE

## 2020-11-01 ENCOUNTER — Other Ambulatory Visit: Payer: Self-pay | Admitting: Nurse Practitioner

## 2020-11-01 ENCOUNTER — Other Ambulatory Visit: Payer: Self-pay

## 2020-11-01 DIAGNOSIS — F419 Anxiety disorder, unspecified: Secondary | ICD-10-CM

## 2020-11-01 MED ORDER — ALPRAZOLAM 0.25 MG PO TABS: 0.2500 mg | ORAL_TABLET | Freq: Every day | ORAL | 0 refills | Status: DC | PRN

## 2020-11-01 NOTE — Telephone Encounter (Signed)
Patient needs it sent to [facility]. I reset the pharmacy. She is there and her car is broke down. She said just lots of things on her right now.  I canceled Rx with [facility] and set it up so you can sign it again.

## 2020-11-01 NOTE — Telephone Encounter (Signed)
Patient said she called pharmacy for Xanax refill and was told it has expired.  [place] wrote at 04/04/20 visit " History of anxiety/depression rare Xanax use."  Would like a refill.

## 2020-11-01 NOTE — Telephone Encounter (Signed)
Refill sent   Thank you

## 2020-12-14 ENCOUNTER — Other Ambulatory Visit: Payer: Self-pay

## 2020-12-14 DIAGNOSIS — N946 Dysmenorrhea, unspecified: Secondary | ICD-10-CM

## 2020-12-14 NOTE — Telephone Encounter (Signed)
Patient saw [place] 04/04/20 for CE and needs refill on Ibuprofen 600 mg that she takes for menstrual cramps.

## 2020-12-15 MED ORDER — IBUPROFEN 600 MG PO TABS: 600.0000 mg | ORAL_TABLET | Freq: Three times a day (TID) | ORAL | 0 refills | Status: DC | PRN

## 2021-04-05 ENCOUNTER — Encounter: Payer: 59 | Admitting: Nurse Practitioner

## 2021-04-05 NOTE — Progress Notes (Deleted)
50 y.o. [id number] Single Black or [place] American female here for annual exam.      No LMP recorded.            Sexually active: {yes no:314532}  The current method of family planning is {contraception:315051}.    Exercising: {yes no:314532}  {types:19826} Smoker:  {YES NO:22349}  Health Maintenance: Pap:  04-04-2020 neg HPV HR neg History of abnormal Pap:  {YES NO:22349} MMG:  03-22-2019 bilateral mmg & bilateral u/s birads 2:neg Colonoscopy:  none BMD:   none Gardasil:   n/a Covid-19: *** Hep C testing: neg 2018 Screening Labs: ***   reports that she has been smoking. She has never used smokeless tobacco. She reports current alcohol use. She reports that she does not use drugs.  Past Medical History:  Diagnosis Date  . Depression   . STD (sexually transmitted disease)    gonorrhea 2011    Past Surgical History:  Procedure Laterality Date  . BREAST SURGERY  2008   breast bx-benign  . TONSILLECTOMY    . TUBAL LIGATION  1998    Current Outpatient Medications  Medication Sig Dispense Refill  . ALPRAZolam (XANAX) 0.25 MG tablet Take 1 tablet (0.25 mg total) by mouth daily as needed for anxiety. 30 tablet 0  . ibuprofen (ADVIL) 600 MG tablet Take 1 tablet (600 mg total) by mouth every 8 (eight) hours as needed. 60 tablet 0  . metroNIDAZOLE (FLAGYL) 500 MG tablet Take 1 tablet (500 mg total) by mouth 2 (two) times daily. 14 tablet 0  . naproxen sodium (ANAPROX) 220 MG tablet Take 220 mg by mouth 2 (two) times daily with a meal.     No current facility-administered medications for this visit.    Family History  Problem Relation Age of Onset  . Diabetes Mother   . Hypertension Sister     Review of Systems  Exam:   There were no vitals taken for this visit.     General appearance: alert, cooperative and appears stated age, no acute distress Head: Normocephalic, without obvious abnormality Neck: no adenopathy, thyroid {EXAM; THYROID:18604} Lungs: clear to  auscultation bilaterally Breasts: {Exam; breast:13139::"normal appearance, no masses or tenderness"} Heart: regular rate and rhythm Abdomen: soft, non-tender; no masses,  no organomegaly Extremities: extremities normal, no edema Skin: No rashes or lesions Lymph nodes: Cervical, supraclavicular, and axillary nodes normal. No abnormal inguinal nodes palpated Neurologic: Grossly normal   Pelvic: External genitalia:  no lesions              Urethra:  normal appearing urethra with no masses, tenderness or lesions              Bartholins and Skenes: normal                 Vagina: normal appearing vagina, appropriate for age, normal appearing discharge, no lesions              Cervix: neg cervical motion tenderness, no visible lesions             Bimanual Exam:   Uterus:  {exam; uterus:12215}              Adnexa: {exam; adnexa:12223}                 ***, CMA Chaperone was present for exam.  A:  Well Woman with normal exam  P:   Pap :  Mammogram:  Labs:  Medications:

## 2021-04-06 ENCOUNTER — Encounter: Payer: 59 | Admitting: Nurse Practitioner

## 2021-04-06 DIAGNOSIS — Z0289 Encounter for other administrative examinations: Secondary | ICD-10-CM

## 2021-04-10 NOTE — Progress Notes (Signed)
50 y.o.   [id number] Single Black or African American female here for annual exam.   Smokes daily about 1 pack per day Wants to start patch  Has fibroadenoma, pt states has not changed, will schedule mammogram  Not sexually active, declines STD testing. Denies vaginal discharge, irritation.  Menses as regular as they have ever been. Denies perimenopausal symptoms  Period Duration (Days): 7 Period Pattern: Regular Menstrual Flow:  (heavy then goes light) Menstrual Control: Maxi pad Dysmenorrhea: (!) Severe Dysmenorrhea Symptoms: Cramping,Headache Patient's last menstrual period was 04/09/2021 (exact date).            Sexually active: No.  The current method of family planning is tubal ligation.    Exercising: No.  exercise Smoker:  no  Health Maintenance: Pap:  04-04-2020 neg HPV HR neg History of abnormal Pap:  no MMG:  03-22-2019 bilateral & rt/left breast u/s birads 2:neg Colonoscopy: none BMD:  none Gardasil:   n/a Covid-19:  done Hep C testing: neg 2018 Screening Labs: Will get today   reports that she has been smoking. She has never used smokeless tobacco. She reports previous alcohol use. She reports that she does not use drugs.  Past Medical History:  Diagnosis Date  . Depression   . Migraines   . STD (sexually transmitted disease)    gonorrhea 2011    Past Surgical History:  Procedure Laterality Date  . BREAST SURGERY  2008   breast bx-benign  . TONSILLECTOMY    . TUBAL LIGATION  1998    Current Outpatient Medications  Medication Sig Dispense Refill  . ALPRAZolam (XANAX) 0.25 MG tablet Take 1 tablet (0.25 mg total) by mouth daily as needed for anxiety. 30 tablet 0  . ibuprofen (ADVIL) 600 MG tablet Take 1 tablet (600 mg total) by mouth every 8 (eight) hours as needed. 60 tablet 0  . naproxen sodium (ANAPROX) 220 MG tablet Take 220 mg by mouth as needed.     No current facility-administered medications for this visit.    Family History  Problem  Relation Age of Onset  . Diabetes Mother   . Hypertension Sister     Review of Systems  Constitutional: Negative.   HENT: Negative.   Eyes: Negative.   Respiratory: Negative.   Cardiovascular: Negative.   Gastrointestinal: Negative.   Endocrine: Negative.   Genitourinary: Negative.   Musculoskeletal: Negative.   Skin: Negative.   Allergic/Immunologic: Negative.   Neurological: Negative.   Hematological: Negative.   Psychiatric/Behavioral: Negative.     Exam:   BP 114/80   Pulse 83   Resp 16   Ht 5' 6.75" (1.695 m)   Wt 128 lb (58.1 kg)   LMP 04/09/2021 (Exact Date)   BMI 20.20 kg/m   Height: 5' 6.75" (169.5 cm)  General appearance: alert, cooperative and appears stated age, no acute distress Head: Normocephalic, without obvious abnormality Neck: no adenopathy, enlarged thyroid with nodule right side Lungs: clear to auscultation bilaterally Breasts: No axillary or supraclavicular adenopathy, palpable fibroadenoma of right breast ~3cm, stable based on previous documentation, mammogram due  Heart: regular rate and rhythm Abdomen: soft, non-tender; no masses,  no organomegaly Extremities: extremities normal, no edema Skin: No rashes or lesions Lymph nodes: Cervical, supraclavicular, and axillary nodes normal. No abnormal inguinal nodes palpated Neurologic: Grossly normal   Pelvic: External genitalia:  no lesions              Urethra:  normal appearing urethra with no masses, tenderness or lesions  Bartholins and Skenes: normal                 Vagina: normal appearing vagina, appropriate for age, small amount of menstrual blood, has menses now, no lesions              Cervix: neg cervical motion tenderness, no visible lesions             Bimanual Exam:   Uterus:  normal size, contour, position, consistency, mobility, non-tender and retroverted              Adnexa: no mass, fullness, tenderness                 A:  Well woman exam with routine  gynecological exam  Enlarged thyroid - Plan: Thyroid Panel With TSH  Screening, lipid - Plan: Lipid panel  Screening for endocrine, metabolic and immunity disorder - Plan: Comp Met (CMET)  Screening for deficiency anemia - Plan: CBC  Tobacco abuse - Plan: nicotine (NICODERM CQ - DOSED IN MG/24 HOURS) 21 mg/24hr patch, nicotine (NICODERM CQ - DOSED IN MG/24 HOURS) 14 mg/24hr patch, nicotine (NICODERM CQ - DOSED IN MG/24 HR) 7 mg/24hr patch  Dysmenorrhea - Plan: ibuprofen (ADVIL) 600 MG tablet   P:   Pap : cotesting done 2021, WNL, due 2026  Mammogram: pt will call to schedule, stable fibroadenoma right Breast  Labs: CBC, CMP, Thyroid panel, Lipid panel  Medications: Ibuprofen, Nicotine patch  Advised to find PCP  Will most likely refer to Endocrinology for nodule/enlarged thyroid, will await results of labs  Advised will not be able to refill Xanax

## 2021-04-13 ENCOUNTER — Other Ambulatory Visit: Payer: Self-pay

## 2021-04-13 ENCOUNTER — Ambulatory Visit (INDEPENDENT_AMBULATORY_CARE_PROVIDER_SITE_OTHER): Payer: 59 | Admitting: Nurse Practitioner

## 2021-04-13 ENCOUNTER — Other Ambulatory Visit: Payer: Self-pay | Admitting: Nurse Practitioner

## 2021-04-13 ENCOUNTER — Encounter: Payer: Self-pay | Admitting: Nurse Practitioner

## 2021-04-13 VITALS — BP 114/80 | HR 83 | Resp 16 | Ht 66.75 in | Wt 128.0 lb

## 2021-04-13 DIAGNOSIS — Z13228 Encounter for screening for other metabolic disorders: Secondary | ICD-10-CM

## 2021-04-13 DIAGNOSIS — Z720 Tobacco use: Secondary | ICD-10-CM | POA: Diagnosis not present

## 2021-04-13 DIAGNOSIS — F17210 Nicotine dependence, cigarettes, uncomplicated: Secondary | ICD-10-CM

## 2021-04-13 DIAGNOSIS — Z130 Encounter for screening for diseases of the blood and blood-forming organs and certain disorders involving the immune mechanism: Secondary | ICD-10-CM

## 2021-04-13 DIAGNOSIS — Z13220 Encounter for screening for lipoid disorders: Secondary | ICD-10-CM | POA: Diagnosis not present

## 2021-04-13 DIAGNOSIS — Z01419 Encounter for gynecological examination (general) (routine) without abnormal findings: Secondary | ICD-10-CM

## 2021-04-13 DIAGNOSIS — E049 Nontoxic goiter, unspecified: Secondary | ICD-10-CM | POA: Diagnosis not present

## 2021-04-13 DIAGNOSIS — Z1231 Encounter for screening mammogram for malignant neoplasm of breast: Secondary | ICD-10-CM

## 2021-04-13 DIAGNOSIS — N946 Dysmenorrhea, unspecified: Secondary | ICD-10-CM

## 2021-04-13 DIAGNOSIS — Z1329 Encounter for screening for other suspected endocrine disorder: Secondary | ICD-10-CM

## 2021-04-13 MED ORDER — NICOTINE 7 MG/24HR TD PT24: 7.0000 mg | MEDICATED_PATCH | Freq: Every day | TRANSDERMAL | 1 refills | Status: DC

## 2021-04-13 MED ORDER — IBUPROFEN 600 MG PO TABS: 600.0000 mg | ORAL_TABLET | Freq: Three times a day (TID) | ORAL | 1 refills | Status: AC | PRN

## 2021-04-13 MED ORDER — NICOTINE 14 MG/24HR TD PT24: 14.0000 mg | MEDICATED_PATCH | Freq: Every day | TRANSDERMAL | 0 refills | Status: DC

## 2021-04-13 MED ORDER — NICOTINE 21 MG/24HR TD PT24: 21.0000 mg | MEDICATED_PATCH | Freq: Every day | TRANSDERMAL | 0 refills | Status: DC

## 2021-04-13 NOTE — Patient Instructions (Addendum)
[facility] schedule [facility]. Please ask them to send the results to Karma Ganja, [facility].  Call 1-800-Quit Now  We will need to follow up on your thyroid pending your lab results. I will be in touch about next steps.   I am proud of you for making the decision to stop smoking. This is so important for your health. I know you can do it!!  Managing the Challenge of Quitting Smoking Quitting smoking is a physical and mental challenge. You will face cravings, withdrawal symptoms, and temptation. Before quitting, work with your health care provider to make a plan that can help you manage quitting. Preparation can help you quit and keep you from giving in. How to manage lifestyle changes Managing stress Stress can make you want to smoke, and wanting to smoke may cause stress. It is important to find ways to manage your stress. You might try some of the following:  Practice relaxation techniques. ? Breathe slowly and deeply, in through your nose and out through your mouth. ? Listen to music. ? Soak in a bath or take a shower. ? Imagine a peaceful place or vacation.  Get some support. ? Talk with family or friends about your stress. ? Join a support group. ? Talk with a counselor or therapist.  Get some physical activity. ? Go for a walk, run, or bike ride. ? Play a favorite sport. ? Practice yoga.   Medicines Talk with your health care provider about medicines that might help you deal with cravings and make quitting easier for you. Relationships Social situations can be difficult when you are quitting smoking. To manage this, you can:  Avoid parties and other social situations where people might be smoking.  Avoid alcohol.  Leave right away if you have the urge to smoke.  Explain to your family and friends that you are quitting smoking. Ask for support and let them know you might be a bit grumpy.  Plan activities where smoking is not an option. General  instructions Be aware that many people gain weight after they quit smoking. However, not everyone does. To keep from gaining weight, have a plan in place before you quit and stick to the plan after you quit. Your plan should include:  Having healthy snacks. When you have a craving, it may help to: ? Eat popcorn, carrots, celery, or other cut vegetables. ? Chew sugar-free gum.  Changing how you eat. ? Eat small portion sizes at meals. ? Eat 4-6 small meals throughout the day instead of 1-2 large meals a day. ? Be mindful when you eat. Do not watch television or do other things that might distract you as you eat.  Exercising regularly. ? Make time to exercise each day. If you do not have time for a long workout, do short bouts of exercise for 5-10 minutes several times a day. ? Do some form of strengthening exercise, such as weight lifting. ? Do some exercise that gets your heart beating and causes you to breathe deeply, such as walking fast, running, swimming, or biking. This is very important.  Drinking plenty of water or other low-calorie or no-calorie drinks. Drink 6-8 glasses of water daily.   How to recognize withdrawal symptoms Your body and mind may experience discomfort as you try to get used to not having nicotine in your system. These effects are called withdrawal symptoms. They may include:  Feeling hungrier than normal.  Having trouble concentrating.  Feeling irritable or restless.  Having trouble sleeping.  Feeling depressed.  Craving a cigarette. To manage withdrawal symptoms:  Avoid places, people, and activities that trigger your cravings.  Remember why you want to quit.  Get plenty of sleep.  Avoid coffee and other caffeinated drinks. These may worsen some of your symptoms. These symptoms may surprise you. But be assured that they are normal to have when quitting smoking. How to manage cravings Come up with a plan for how to deal with your cravings. The plan  should include the following:  A definition of the specific situation you want to deal with.  An alternative action you will take.  A clear idea for how this action will help.  The name of someone who might help you with this. Cravings usually last for 5-10 minutes. Consider taking the following actions to help you with your plan to deal with cravings:  Keep your mouth busy. ? Chew sugar-free gum. ? Suck on hard candies or a straw. ? Brush your teeth.  Keep your hands and body busy. ? Change to a different activity right away. ? Squeeze or play with a ball. ? Do an activity or a hobby, such as making bead jewelry, practicing needlepoint, or working with wood. ? Mix up your normal routine. ? Take a short exercise break. Go for a quick walk or run up and down stairs.  Focus on doing something kind or helpful for someone else.  Call a friend or family member to talk during a craving.  Join a support group.  Contact a quitline. Where to find support To get help or find a support group:  Call the [employer]'s Smoking Quitline: 1-800-QUIT NOW (343)023-8707)  Visit the website of the Substance Abuse and [place]: ktimeonline.com  Text QUIT to SmokefreeTXT: 308657 Where to find more information Visit these websites to find more information on quitting smoking:  [place]: www.smokefree.gov  American Lung Association: www.lung.org  American Cancer Society: www.cancer.org  Centers for Disease Control and Prevention: http://www.wolf.info/  American Heart Association: www.heart.org Contact a health care provider if:  You want to change your plan for quitting.  The medicines you are taking are not helping.  Your eating feels out of control or you cannot sleep. Get help right away if:  You feel depressed or become very anxious. Summary  Quitting smoking is a physical and mental challenge. You will face cravings, withdrawal  symptoms, and temptation to smoke again. Preparation can help you as you go through these challenges.  Try different techniques to manage stress, handle social situations, and prevent weight gain.  You can deal with cravings by keeping your mouth busy (such as by chewing gum), keeping your hands and body busy, calling family or friends, or contacting a quitline for people who want to quit smoking.  You can deal with withdrawal symptoms by avoiding places where people smoke, getting plenty of rest, and avoiding drinks with caffeine. This information is not intended to replace advice given to you by your health care provider. Make sure you discuss any questions you have with your health care provider. Document Revised: 09/14/2019 Document Reviewed: 09/14/2019 Elsevier Patient Education  [place] Maintenance, Female Adopting a healthy lifestyle and getting preventive care are important in promoting health and wellness. Ask your health care provider about:  The right schedule for you to have regular tests and exams.  Things you can do on your own to prevent diseases and keep yourself  healthy. What should I know about diet, weight, and exercise? Eat a healthy diet  Eat a diet that includes plenty of vegetables, fruits, low-fat dairy products, and lean protein.  Do not eat a lot of foods that are high in solid fats, added sugars, or sodium.   Maintain a healthy weight Body mass index (BMI) is used to identify weight problems. It estimates body fat based on height and weight. Your health care provider can help determine your BMI and help you achieve or maintain a healthy weight. Get regular exercise Get regular exercise. This is one of the most important things you can do for your health. Most adults should:  Exercise for at least 150 minutes each week. The exercise should increase your heart rate and make you sweat (moderate-intensity exercise).  Do strengthening  exercises at least twice a week. This is in addition to the moderate-intensity exercise.  Spend less time sitting. Even light physical activity can be beneficial. Watch cholesterol and blood lipids Have your blood tested for lipids and cholesterol at 48 years of age, then have this test every 5 years. Have your cholesterol levels checked more often if:  Your lipid or cholesterol levels are high.  You are older than 48 years of age.  You are at high risk for heart disease. What should I know about cancer screening? Depending on your health history and family history, you may need to have cancer screening at various ages. This may include screening for:  Breast cancer.  Cervical cancer.  Colorectal cancer.  Skin cancer.  Lung cancer. What should I know about heart disease, diabetes, and high blood pressure? Blood pressure and heart disease  High blood pressure causes heart disease and increases the risk of stroke. This is more likely to develop in people who have high blood pressure readings, are of African descent, or are overweight.  Have your blood pressure checked: ? Every 3-5 years if you are 47-1 years of age. ? Every year if you are 61 years old or older. Diabetes Have regular diabetes screenings. This checks your fasting blood sugar level. Have the screening done:  Once every three years after age 77 if you are at a normal weight and have a low risk for diabetes.  More often and at a younger age if you are overweight or have a high risk for diabetes. What should I know about preventing infection? Hepatitis B If you have a higher risk for hepatitis B, you should be screened for this virus. Talk with your health care provider to find out if you are at risk for hepatitis B infection. Hepatitis C Testing is recommended for:  Everyone born from [birth year] through 1963.  Anyone with known risk factors for hepatitis C. Sexually transmitted infections (STIs)  Get screened  for STIs, including gonorrhea and chlamydia, if: ? You are sexually active and are younger than 48 years of age. ? You are older than 48 years of age and your health care provider tells you that you are at risk for this type of infection. ? Your sexual activity has changed since you were last screened, and you are at increased risk for chlamydia or gonorrhea. Ask your health care provider if you are at risk.  Ask your health care provider about whether you are at high risk for HIV. Your health care provider may recommend a prescription medicine to help prevent HIV infection. If you choose to take medicine to prevent HIV, you should first get tested for  HIV. You should then be tested every 3 months for as long as you are taking the medicine. Pregnancy  If you are about to stop having your period (premenopausal) and you may become pregnant, seek counseling before you get pregnant.  Take 400 to 800 micrograms (mcg) of folic acid every day if you become pregnant.  Ask for birth control (contraception) if you want to prevent pregnancy. Osteoporosis and menopause Osteoporosis is a disease in which the bones lose minerals and strength with aging. This can result in bone fractures. If you are 53 years old or older, or if you are at risk for osteoporosis and fractures, ask your health care provider if you should:  Be screened for bone loss.  Take a calcium or vitamin D supplement to lower your risk of fractures.  Be given hormone replacement therapy (HRT) to treat symptoms of menopause. Follow these instructions at home: Lifestyle  Do not use any products that contain nicotine or tobacco, such as cigarettes, e-cigarettes, and chewing tobacco. If you need help quitting, ask your health care provider.  Do not use street drugs.  Do not share needles.  Ask your health care provider for help if you need support or information about quitting drugs. Alcohol use  Do not drink alcohol if: ? Your  health care provider tells you not to drink. ? You are pregnant, may be pregnant, or are planning to become pregnant.  If you drink alcohol: ? Limit how much you use to 0-1 drink a day. ? Limit intake if you are breastfeeding.  Be aware of how much alcohol is in your drink. In the [place] one drink equals one 12 oz bottle of beer (355 mL), one 5 oz glass of wine (148 mL), or one 1 oz glass of hard liquor (44 mL). General instructions  Schedule regular health, dental, and eye exams.  Stay current with your vaccines.  Tell your health care provider if: ? You often feel depressed. ? You have ever been abused or do not feel safe at home. Summary  Adopting a healthy lifestyle and getting preventive care are important in promoting health and wellness.  Follow your health care provider's instructions about healthy diet, exercising, and getting tested or screened for diseases.  Follow your health care provider's instructions on monitoring your cholesterol and blood pressure. This information is not intended to replace advice given to you by your health care provider. Make sure you discuss any questions you have with your health care provider. Document Revised: 11/18/2018 Document Reviewed: 11/18/2018 Elsevier Patient Education  2021 [employer].

## 2021-04-14 LAB — THYROID PANEL WITH TSH
Free Thyroxine Index: 2.1 (ref 1.4–3.8)
T3 Uptake: 29 % (ref 22–35)
T4, Total: 7.1 ug/dL (ref 5.1–11.9)
TSH: 0.49 mIU/L

## 2021-04-14 LAB — COMPREHENSIVE METABOLIC PANEL
AG Ratio: 1.8 (calc) (ref 1.0–2.5)
ALT: 8 U/L (ref 6–29)
AST: 13 U/L (ref 10–35)
Albumin: 4.5 g/dL (ref 3.6–5.1)
Alkaline phosphatase (APISO): 61 U/L (ref 31–125)
BUN: 7 mg/dL (ref 7–25)
CO2: 26 mmol/L (ref 20–32)
Calcium: 9.1 mg/dL (ref 8.6–10.2)
Chloride: 104 mmol/L (ref 98–110)
Creat: 0.66 mg/dL (ref 0.50–1.10)
Globulin: 2.5 g/dL (calc) (ref 1.9–3.7)
Glucose, Bld: 89 mg/dL (ref 65–99)
Potassium: 3.6 mmol/L (ref 3.5–5.3)
Sodium: 139 mmol/L (ref 135–146)
Total Bilirubin: 0.6 mg/dL (ref 0.2–1.2)
Total Protein: 7 g/dL (ref 6.1–8.1)

## 2021-04-14 LAB — LIPID PANEL
Cholesterol: 204 mg/dL — ABNORMAL HIGH (ref <200)
HDL: 55 mg/dL (ref >=50)
LDL Cholesterol (Calc): 129 mg/dL (calc) — ABNORMAL HIGH
Non-HDL Cholesterol (Calc): 149 mg/dL (calc) — ABNORMAL HIGH (ref <130)
Total CHOL/HDL Ratio: 3.7 (calc) (ref <5.0)
Triglycerides: 95 mg/dL (ref <150)

## 2021-04-14 LAB — CBC
HCT: 41.8 % (ref 35.0–45.0)
Hemoglobin: 13.5 g/dL (ref 11.7–15.5)
MCH: 27.4 pg (ref 27.0–33.0)
MCHC: 32.3 g/dL (ref 32.0–36.0)
MCV: 85 fL (ref 80.0–100.0)
MPV: 11.6 fL (ref 7.5–12.5)
Platelets: 245 10*3/uL (ref 140–400)
RBC: 4.92 10*6/uL (ref 3.80–5.10)
RDW: 13.3 % (ref 11.0–15.0)
WBC: 6.3 10*3/uL (ref 3.8–10.8)

## 2021-04-16 ENCOUNTER — Other Ambulatory Visit: Payer: Self-pay | Admitting: Nurse Practitioner

## 2021-04-16 NOTE — Progress Notes (Signed)
Please notify that Chemistry panel (blood sugar, electrolytes, kidney function and liver enzymes), CBC, and Thyroid panel are normal. Cholesterol is a little elevated. It will be important to focus on Lifestyle to improve this. The efforts to quit smoking are so important because being a non smoker will really reduce the risk of heart disease. Eat low fat foods, many fruits and vegetables and exercise daily (150 min per week) to improve heart health. We need to recheck cholesterol in 1 year.  Also, please refer to endocrinology for evaluation of thyroid nodule.

## 2021-04-17 ENCOUNTER — Telehealth: Payer: Self-pay | Admitting: *Deleted

## 2021-04-17 DIAGNOSIS — E041 Nontoxic single thyroid nodule: Secondary | ICD-10-CM

## 2021-04-17 NOTE — Telephone Encounter (Signed)
-----   Message from Karma Ganja, NP sent at 04/16/2021  5:22 PM EDT ----- Please refer to endocrine for evaluation of thyroid nodule

## 2021-04-17 NOTE — Telephone Encounter (Signed)
Referral placed at [facility] they will call to schedule. Patient aware of this as well.

## 2021-04-30 NOTE — Telephone Encounter (Signed)
Whitney Gay has called patient said she will call back to schedule on lunch break.

## 2021-05-11 NOTE — Telephone Encounter (Signed)
Patient scheduled on 08/01/21 with Dr.Ellison

## 2021-06-08 ENCOUNTER — Other Ambulatory Visit: Payer: Self-pay

## 2021-06-08 ENCOUNTER — Ambulatory Visit
Admission: RE | Admit: 2021-06-08 | Discharge: 2021-06-08 | Disposition: A | Discharge status: Home / Self Care | Payer: 59 | Source: Ambulatory Visit | Attending: Nurse Practitioner | Admitting: Nurse Practitioner

## 2021-06-08 DIAGNOSIS — Z1231 Encounter for screening mammogram for malignant neoplasm of breast: Secondary | ICD-10-CM

## 2021-06-15 ENCOUNTER — Other Ambulatory Visit: Payer: Self-pay | Admitting: Nurse Practitioner

## 2021-06-15 ENCOUNTER — Other Ambulatory Visit: Payer: Self-pay | Admitting: *Deleted

## 2021-06-15 DIAGNOSIS — R928 Other abnormal and inconclusive findings on diagnostic imaging of breast: Secondary | ICD-10-CM

## 2021-06-18 ENCOUNTER — Other Ambulatory Visit: Payer: Self-pay | Admitting: Obstetrics and Gynecology

## 2021-06-18 ENCOUNTER — Other Ambulatory Visit: Payer: Self-pay | Admitting: Nurse Practitioner

## 2021-06-18 DIAGNOSIS — R928 Other abnormal and inconclusive findings on diagnostic imaging of breast: Secondary | ICD-10-CM

## 2021-06-25 ENCOUNTER — Other Ambulatory Visit: Payer: Self-pay | Admitting: Obstetrics and Gynecology

## 2021-07-10 ENCOUNTER — Telehealth: Payer: Self-pay | Admitting: *Deleted

## 2021-07-10 ENCOUNTER — Ambulatory Visit
Admission: RE | Admit: 2021-07-10 | Discharge: 2021-07-10 | Disposition: A | Discharge status: Home / Self Care | Payer: 59 | Source: Ambulatory Visit | Attending: Nurse Practitioner | Admitting: Nurse Practitioner

## 2021-07-10 ENCOUNTER — Other Ambulatory Visit: Payer: Self-pay

## 2021-07-10 ENCOUNTER — Other Ambulatory Visit: Payer: Self-pay | Admitting: Nurse Practitioner

## 2021-07-10 DIAGNOSIS — R921 Mammographic calcification found on diagnostic imaging of breast: Secondary | ICD-10-CM

## 2021-07-10 DIAGNOSIS — R928 Other abnormal and inconclusive findings on diagnostic imaging of breast: Secondary | ICD-10-CM

## 2021-07-10 NOTE — Telephone Encounter (Signed)
Orders pended.  For MM LT Breast Bx and MM clip placement left for left breast calcifications  Orders originally sent to Karma Ganja, NP  Routing to Dr. Talbert Nan

## 2021-07-20 ENCOUNTER — Ambulatory Visit
Admission: RE | Admit: 2021-07-20 | Discharge: 2021-07-20 | Disposition: A | Discharge status: Home / Self Care | Payer: 59 | Source: Ambulatory Visit | Attending: Nurse Practitioner | Admitting: Nurse Practitioner

## 2021-07-20 ENCOUNTER — Other Ambulatory Visit: Payer: Self-pay

## 2021-07-20 DIAGNOSIS — R921 Mammographic calcification found on diagnostic imaging of breast: Secondary | ICD-10-CM

## 2021-07-30 NOTE — Telephone Encounter (Signed)
Imaging completed on 07/20/21.   Encounter closed.

## 2021-08-01 ENCOUNTER — Other Ambulatory Visit: Payer: Self-pay

## 2021-08-01 ENCOUNTER — Ambulatory Visit (INDEPENDENT_AMBULATORY_CARE_PROVIDER_SITE_OTHER): Payer: 59 | Admitting: Endocrinology

## 2021-08-01 ENCOUNTER — Encounter: Payer: Self-pay | Admitting: Endocrinology

## 2021-08-01 DIAGNOSIS — E041 Nontoxic single thyroid nodule: Secondary | ICD-10-CM | POA: Insufficient documentation

## 2021-08-01 NOTE — Patient Instructions (Addendum)
Your blood pressure is high today.  Please see your primary care provider soon, to have it rechecked Let's check the ultrasound.  you will receive a phone call, about a day and time for an appointment.  Please come back for a follow-up appointment in 6 months.

## 2021-08-01 NOTE — Progress Notes (Signed)
Subjective:    Patient ID: Whitney Gay, female    DOB: 1971-10-20, 50 y.o.   MRN: [id number]  HPI Pt is referred by Karma Ganja, NP, for nodular thyroid.  Pt was noted to have a thyroid nodule in 2022.  she is unaware of ever having had thyroid problems in the past.  she has no h/o XRT or surgery to the neck.   Past Medical History:  Diagnosis Date   Depression    Migraines    STD (sexually transmitted disease)    gonorrhea 2011    Past Surgical History:  Procedure Laterality Date   BREAST SURGERY  2008   breast bx-benign   TONSILLECTOMY     TUBAL LIGATION  1998    Social History   Socioeconomic History   Marital status: Single    Spouse name: Not on file   Number of children: Not on file   Years of education: Not on file   Highest education level: Not on file  Occupational History   Not on file  Tobacco Use   Smoking status: Every Day   Smokeless tobacco: Never  Vaping Use   Vaping Use: Never used  Substance and Sexual Activity   Alcohol use: Not Currently   Drug use: Never   Sexual activity: Not Currently    Birth control/protection: Surgical    Comment: BTL  Other Topics Concern   Not on file  Social History Narrative   Not on file   Social Determinants of Health   Financial Resource Strain: Not on file  Food Insecurity: Not on file  Transportation Needs: Not on file  Physical Activity: Not on file  Stress: Not on file  Social Connections: Not on file  Intimate Partner Violence: Not on file    Current Outpatient Medications on File Prior to Visit  Medication Sig Dispense Refill   ALPRAZolam (XANAX) 0.25 MG tablet Take 1 tablet (0.25 mg total) by mouth daily as needed for anxiety. 30 tablet 0   ibuprofen (ADVIL) 600 MG tablet Take 1 tablet (600 mg total) by mouth every 8 (eight) hours as needed. 60 tablet 1   nicotine (NICODERM CQ - DOSED IN MG/24 HOURS) 14 mg/24hr patch Place 1 patch (14 mg total) onto the skin daily. Once completed, step down to  the next lower dose (7 mg) 28 patch 0   nicotine (NICODERM CQ - DOSED IN MG/24 HOURS) 21 mg/24hr patch Place 1 patch (21 mg total) onto the skin daily. When completed, step down to next lower dose ('14mg'$ ) 28 patch 0   nicotine (NICODERM CQ - DOSED IN MG/24 HR) 7 mg/24hr patch Place 1 patch (7 mg total) onto the skin daily. 28 patch 1   No current facility-administered medications on file prior to visit.    Allergies  Allergen Reactions   Shellfish Allergy     Family History  Problem Relation Age of Onset   Thyroid disease Mother    Diabetes Mother    Hypertension Sister     BP (!) 146/80 (BP Location: Right Arm, Patient Position: Sitting, Cuff Size: Normal)   Pulse 91   Ht 5' 6.75" (1.695 m)   Wt 137 lb 6.4 oz (62.3 kg)   SpO2 97%   BMI 21.68 kg/m     Review of Systems Denies weight change, hoarseness, neck pain, sob, and flushing.      Objective:   Physical Exam NECK: 2 cm RUP thyroid nodule is easily palpable.  No palpable  lymphadenopathy at the anterior neck.    Lab Results  Component Value Date   TSH 0.49 04/13/2021   T4TOTAL 7.1 04/13/2021   I have reviewed outside records, and summarized: Pt was noted to have thyroid nodule and referred here.  She was seen for well woman visit, and it was incidentally noted    Assessment & Plan:  Thyroid nodule, in the context of borderline low TSH.  This raises question of hyperfunctioning nodule.  We discussed risk.  She declines bx and nuc medicine scan.  She chooses [place] instead.   Patient Instructions  Your blood pressure is high today.  Please see your primary care provider soon, to have it rechecked Let's check the ultrasound.  you will receive a phone call, about a day and time for an appointment.  Please come back for a follow-up appointment in 6 months.

## 2021-08-10 ENCOUNTER — Telehealth: Payer: Self-pay | Admitting: *Deleted

## 2021-08-10 NOTE — Telephone Encounter (Signed)
Patient was referred to [facility] endocrinology by Karma Ganja, NP for thyroid nodule.    Patient was seen by Dr. Loanne Drilling on 08/01/21, note in Epic.    Routing to Marny Lowenstein, NP FYI.   Encounter closed.

## 2021-08-14 ENCOUNTER — Ambulatory Visit
Admission: RE | Admit: 2021-08-14 | Discharge: 2021-08-14 | Disposition: A | Discharge status: Home / Self Care | Payer: 59 | Source: Ambulatory Visit | Attending: Endocrinology | Admitting: Endocrinology

## 2021-08-14 DIAGNOSIS — E041 Nontoxic single thyroid nodule: Secondary | ICD-10-CM

## 2022-02-01 ENCOUNTER — Other Ambulatory Visit: Payer: Self-pay

## 2022-02-01 ENCOUNTER — Ambulatory Visit: Payer: 59 | Admitting: Endocrinology

## 2022-02-01 ENCOUNTER — Ambulatory Visit (INDEPENDENT_AMBULATORY_CARE_PROVIDER_SITE_OTHER): Payer: 59 | Admitting: Endocrinology

## 2022-02-01 ENCOUNTER — Encounter: Payer: Self-pay | Admitting: Endocrinology

## 2022-02-01 VITALS — BP 128/88 | HR 84 | Ht 66.75 in | Wt 138.2 lb

## 2022-02-01 DIAGNOSIS — E041 Nontoxic single thyroid nodule: Secondary | ICD-10-CM

## 2022-02-01 LAB — T4, FREE: Free T4: 0.7 ng/dL (ref 0.60–1.60)

## 2022-02-01 LAB — TSH: TSH: 0.42 u[IU]/mL (ref 0.35–5.50)

## 2022-02-01 NOTE — Patient Instructions (Addendum)
Let's check the nuclear medicine scan.  you will receive a phone call, about a day and time for an appointment.   Blood tests are requested for you today.  We'll let you know about the results.    Please come back for a follow-up appointment in 6 months.

## 2022-02-01 NOTE — Progress Notes (Signed)
Subjective:    Patient ID: Whitney Gay, female    DOB: August 20, 1971, 51 y.o.   MRN: [id number]  HPI Pt returns for f/u of solitary thyroid nodule (dx'ed 2022: she declines bx and nuc medicine scan: TSH is borderline low).  She is unaware of any change in size Past Medical History:  Diagnosis Date   Depression    Migraines    STD (sexually transmitted disease)    gonorrhea 2011    Past Surgical History:  Procedure Laterality Date   BREAST SURGERY  2008   breast bx-benign   TONSILLECTOMY     TUBAL LIGATION  1998    Social History   Socioeconomic History   Marital status: Single    Spouse name: Not on file   Number of children: Not on file   Years of education: Not on file   Highest education level: Not on file  Occupational History   Not on file  Tobacco Use   Smoking status: Every Day   Smokeless tobacco: Never  Vaping Use   Vaping Use: Never used  Substance and Sexual Activity   Alcohol use: Not Currently   Drug use: Never   Sexual activity: Not Currently    Birth control/protection: Surgical    Comment: BTL  Other Topics Concern   Not on file  Social History Narrative   Not on file   Social Determinants of Health   Financial Resource Strain: Not on file  Food Insecurity: Not on file  Transportation Needs: Not on file  Physical Activity: Not on file  Stress: Not on file  Social Connections: Not on file  Intimate Partner Violence: Not on file    Current Outpatient Medications on File Prior to Visit  Medication Sig Dispense Refill   ALPRAZolam (XANAX) 0.25 MG tablet Take 1 tablet (0.25 mg total) by mouth daily as needed for anxiety. 30 tablet 0   ibuprofen (ADVIL) 600 MG tablet Take 1 tablet (600 mg total) by mouth every 8 (eight) hours as needed. 60 tablet 1   nicotine (NICODERM CQ - DOSED IN MG/24 HOURS) 14 mg/24hr patch Place 1 patch (14 mg total) onto the skin daily. Once completed, step down to the next lower dose (7 mg) 28 patch 0   nicotine  (NICODERM CQ - DOSED IN MG/24 HOURS) 21 mg/24hr patch Place 1 patch (21 mg total) onto the skin daily. When completed, step down to next lower dose (14mg ) 28 patch 0   nicotine (NICODERM CQ - DOSED IN MG/24 HR) 7 mg/24hr patch Place 1 patch (7 mg total) onto the skin daily. 28 patch 1   No current facility-administered medications on file prior to visit.    Allergies  Allergen Reactions   Shellfish Allergy     Family History  Problem Relation Age of Onset   Thyroid disease Mother    Diabetes Mother    Hypertension Sister     BP 128/88 (BP Location: Left Arm, Patient Position: Sitting, Cuff Size: Normal)    Pulse 84    Ht 5' 6.75" (1.695 m)    Wt 138 lb 3.2 oz (62.7 kg)    SpO2 98%    BMI 21.81 kg/m   Review of Systems     Objective:   Physical Exam VITAL SIGNS:  See vs page GENERAL: no distress NECK: 2 cm RUP thyroid nodule is again easily palpable.  No palpable lymphadenopathy at the anterior neck.      Lab Results  Component Value  Date   TSH 0.42 02/01/2022   T4TOTAL 7.1 04/13/2021      Assessment & Plan:  Solitary thyroid nodule.  We discussed options of scan vs bx.  She again declines bx, but she agrees to nuc med scan  Patient Instructions  Let's check the nuclear medicine scan.  you will receive a phone call, about a day and time for an appointment.   Blood tests are requested for you today.  We'll let you know about the results.    Please come back for a follow-up appointment in 6 months.

## 2022-04-15 ENCOUNTER — Encounter: Payer: Self-pay | Admitting: Nurse Practitioner

## 2022-04-15 ENCOUNTER — Ambulatory Visit: Payer: 59 | Admitting: Nurse Practitioner

## 2022-04-15 ENCOUNTER — Ambulatory Visit (INDEPENDENT_AMBULATORY_CARE_PROVIDER_SITE_OTHER): Payer: 59 | Admitting: Nurse Practitioner

## 2022-04-15 VITALS — BP 120/80 | Ht 66.0 in | Wt 139.0 lb

## 2022-04-15 DIAGNOSIS — E785 Hyperlipidemia, unspecified: Secondary | ICD-10-CM | POA: Diagnosis not present

## 2022-04-15 DIAGNOSIS — Z01419 Encounter for gynecological examination (general) (routine) without abnormal findings: Secondary | ICD-10-CM

## 2022-04-15 DIAGNOSIS — F419 Anxiety disorder, unspecified: Secondary | ICD-10-CM | POA: Diagnosis not present

## 2022-04-15 DIAGNOSIS — Z720 Tobacco use: Secondary | ICD-10-CM | POA: Diagnosis not present

## 2022-04-15 MED ORDER — ALPRAZOLAM 0.25 MG PO TABS: 0.2500 mg | ORAL_TABLET | Freq: Every day | ORAL | 0 refills | Status: DC | PRN

## 2022-04-15 MED ORDER — NICOTINE 7 MG/24HR TD PT24: 7.0000 mg | MEDICATED_PATCH | Freq: Every day | TRANSDERMAL | 1 refills | Status: AC

## 2022-04-15 MED ORDER — NICOTINE 14 MG/24HR TD PT24: 14.0000 mg | MEDICATED_PATCH | Freq: Every day | TRANSDERMAL | 0 refills | Status: AC

## 2022-04-15 NOTE — Progress Notes (Signed)
? ?Whitney Gay 1971/04/23 [id number] ? ? ?History:  51 y.o. [id number] presents for annual exam. Monthly cycles. Normal pap history. Smoker. Was prescribed Nicotine patches last year which helped but she did not complete course. Smoking 1/2 ppd. Would like patches again. Thyroid nodule being followed by endocrinology.  ? ?Gynecologic History ?Patient's last menstrual period was 03/25/2022. ?Period Cycle (Days): 28 ?Period Duration (Days): 7 ?Period Pattern: Regular ?Menstrual Flow: Moderate ?Dysmenorrhea: (!) Moderate ?Dysmenorrhea Symptoms: Cramping ?Contraception/Family planning: tubal ligation ?Sexually active: Yes ? ?Health Maintenance ?Last Pap: 04/04/2020. Results were: Normal, 5-year repeat ?Last mammogram: 7/1//2022. Results were: left breast calcifications (biopsy confirmed columnar changes, clip placed), benign right breast cyst ?Last colonoscopy: Never ?Last Dexa: Not indicated ? ?Past medical history, past surgical history, family history and social history were all reviewed and documented in the EPIC chart. 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 56 yo son, 24 yo daughter. Mother with thyroid disease, diabetes.  ? ?ROS:  A ROS was performed and pertinent positives and negatives are included. ? ?Exam: ? ?Vitals:  ? 04/15/22 1508  ?BP: 120/80  ?Weight: 139 lb (63 kg)  ?Height: '5\' 6"'$  (1.676 m)  ? ?Body mass index is 22.44 kg/m?. ? ?General appearance:  Normal ?Thyroid:  Symmetrical, normal in size, without palpable masses or nodularity. ?Respiratory ? Auscultation:  Clear without wheezing or rhonchi ?Cardiovascular ? Auscultation:  Regular rate, without rubs, murmurs or gallops ? Edema/varicosities:  Not grossly evident ?Abdominal ? Soft,nontender, without masses, guarding or rebound. ? Liver/spleen:  No organomegaly noted ? Hernia:  None appreciated ? Skin ? Inspection:  Grossly normal ?Breasts: Examined lying and sitting.  ? Right: Without masses, retractions, nipple discharge or axillary  adenopathy. ? ? Left: Without masses, retractions, nipple discharge or axillary adenopathy. ?Genitourinary  ? Inguinal/mons:  Normal without inguinal adenopathy ? External genitalia:  Normal appearing vulva with no masses, tenderness, or lesions ? BUS/Urethra/Skene's glands:  Normal ? Vagina:  Normal appearing with normal color and discharge, no lesions ? Cervix:  Normal appearing without discharge or lesions ? Uterus:  Normal in size, shape and contour.  Midline and mobile, nontender ? Adnexa/parametria:   ?  Rt: Normal in size, without masses or tenderness. ?  Lt: Normal in size, without masses or tenderness. ? Anus and perineum: Normal ? Digital rectal exam: Normal sphincter tone without palpated masses or tenderness ? ?Patient informed chaperone available to be present for breast and pelvic exam. Patient has requested no chaperone to be present. Patient has been advised what will be completed during breast and pelvic exam.  ? ?Assessment/Plan:  51 y.o. [id number] for annual exam.  ? ?Well female exam with routine gynecological exam - Plan: CBC with Differential/Platelet, Comprehensive metabolic panel. Education provided on SBEs, importance of preventative screenings, current guidelines, high calcium diet, regular exercise, and multivitamin daily. Will return for fasting labs.  ? ?Anxiety - Plan: ALPRAZolam (XANAX) 0.25 MG tablet as needed for anxiety. She takes rarely and is aware of addictive properties.  ? ?Tobacco abuse - Plan: nicotine (NICODERM CQ - DOSED IN MG/24 HOURS) 14 mg/24hr patch daily x 28 days, then decrease to nicotine (NICODERM CQ - DOSED IN MG/24 HR) 7 mg/24hr patch x 28 days. Smoking 1/2 ppd.  ? ?Hyperlipidemia, unspecified hyperlipidemia type - Plan: Lipid panel ? ?Screening for cervical cancer - Normal Pap history.  Will repeat at 5-year interval per guidelines. ? ?Screening for breast cancer - Abnormal mammogram last year, benign calcifications, clip placed. Recommendations to return annual  screenings. Normal breast exam today. ? ?Screening for colon cancer - Has not had screening for colonoscopy. Discussed current guidelines and importance of preventative screenings. Information provided on [facility].  ? ?Return in 1 year for annual.  ? ?Tamela Gammon DNP, 3:44 PM 04/15/2022 ? ?

## 2022-04-15 NOTE — Patient Instructions (Signed)
Schedule Colonoscopy! ?[facility] ?[phone] ?[address] [place], [place] [address] ? ?

## 2022-04-23 ENCOUNTER — Other Ambulatory Visit: Payer: 59

## 2022-04-23 ENCOUNTER — Encounter
Admission: RE | Admit: 2022-04-23 | Discharge: 2022-04-23 | Disposition: A | Discharge status: Home / Self Care | Payer: 59 | Source: Ambulatory Visit | Attending: Endocrinology | Admitting: Endocrinology

## 2022-04-23 ENCOUNTER — Ambulatory Visit
Admission: RE | Admit: 2022-04-23 | Discharge: 2022-04-23 | Disposition: A | Discharge status: Home / Self Care | Payer: 59 | Source: Ambulatory Visit | Attending: Endocrinology | Admitting: Endocrinology

## 2022-04-23 DIAGNOSIS — E041 Nontoxic single thyroid nodule: Secondary | ICD-10-CM | POA: Diagnosis not present

## 2022-04-23 MED ORDER — SODIUM IODIDE I-123 7.4 MBQ CAPS
306.0000 | ORAL_CAPSULE | Freq: Once | ORAL | Status: AC
  Administered 2022-04-23: 306 via ORAL

## 2022-04-24 ENCOUNTER — Encounter
Admission: RE | Admit: 2022-04-24 | Discharge: 2022-04-24 | Disposition: A | Discharge status: Home / Self Care | Payer: 59 | Source: Ambulatory Visit | Attending: Endocrinology | Admitting: Endocrinology

## 2022-05-01 IMAGING — MG MM DIGITAL SCREENING BILAT W/ TOMO AND CAD
8 series · 8 of 24 positions shown · non-contrast
Comparison: Previous exam(s).

CLINICAL DATA: Screening.

EXAM:
DIGITAL SCREENING BILATERAL MAMMOGRAM WITH TOMOSYNTHESIS AND CAD
TECHNIQUE: Bilateral screening digital craniocaudal and mediolateral oblique
mammograms were obtained. Bilateral screening digital breast
tomosynthesis was performed. The images were evaluated with
computer-aided detection.

[L MLO synth-2D]
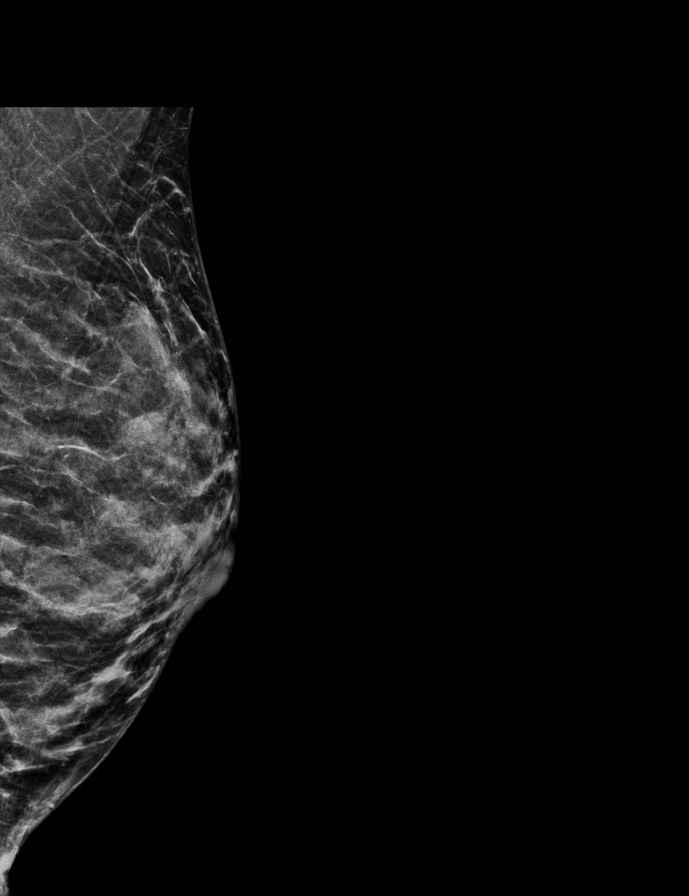

[R MLO synth-2D]
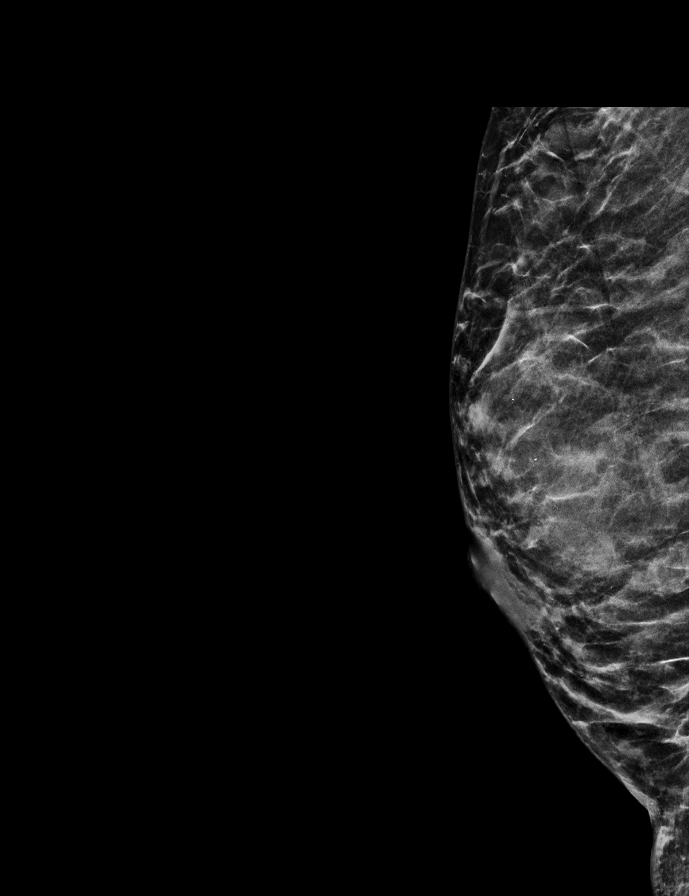

[L CC synth-2D]
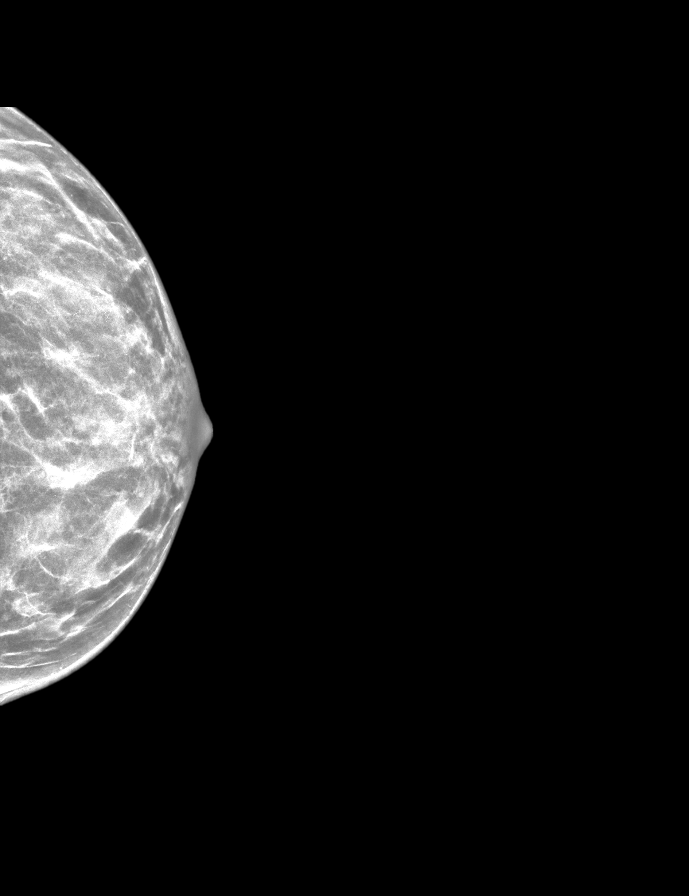

[R CC synth-2D]
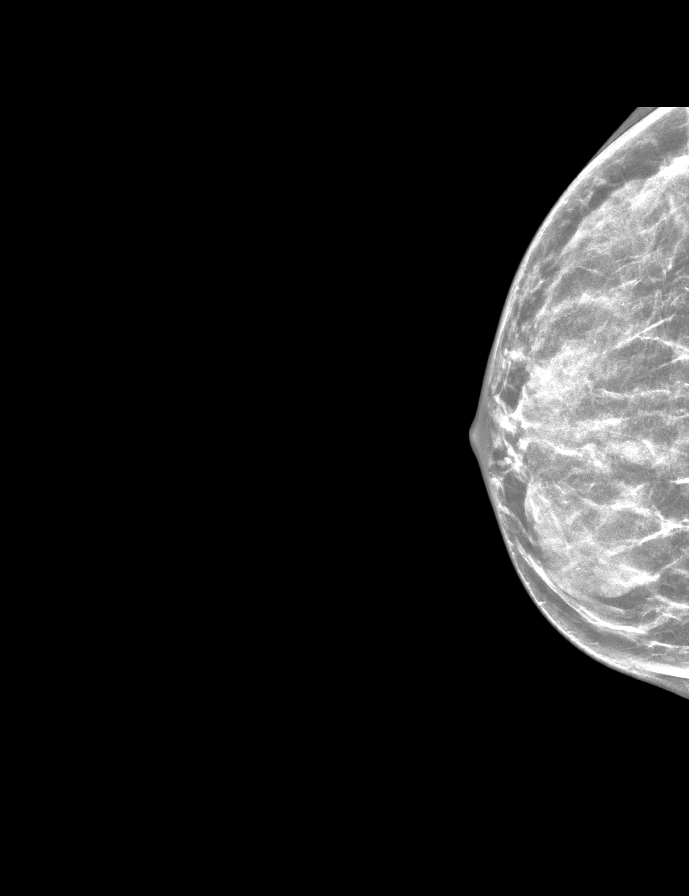

[L CC tomo · tomo slice 21/40.0]
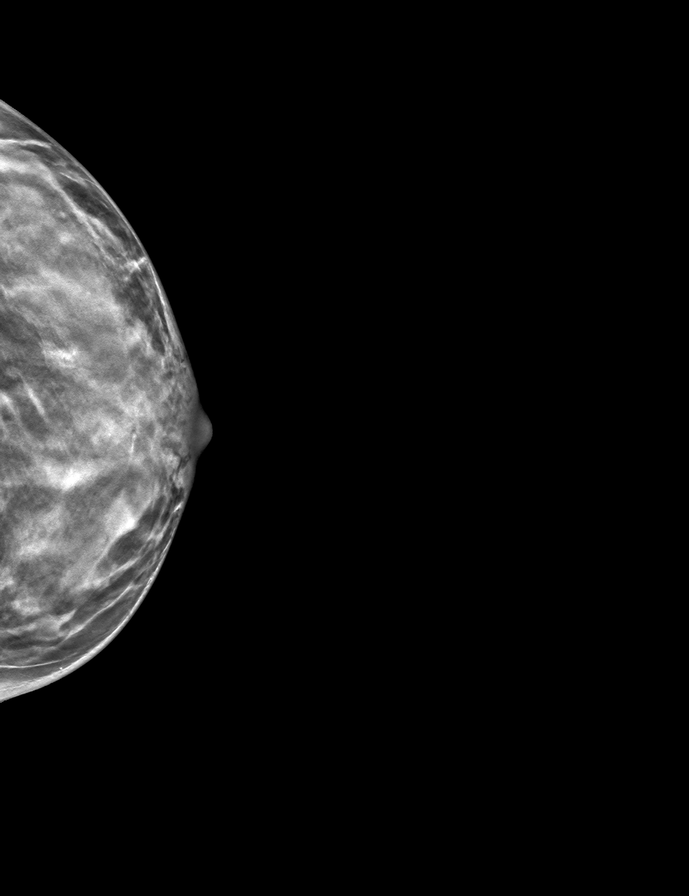

[R MLO tomo · tomo slice 20/39.0]
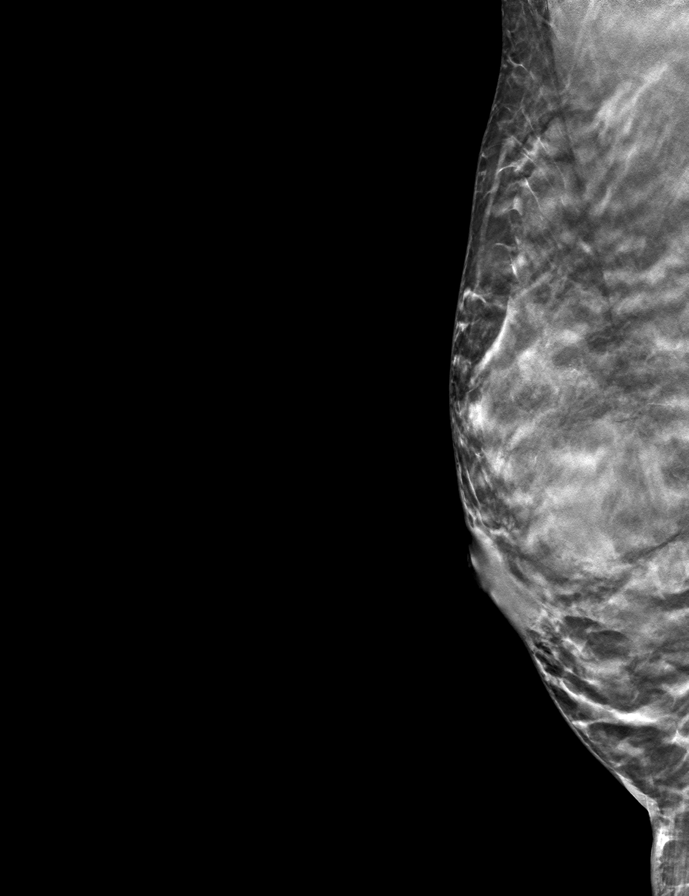

[R CC tomo · tomo slice 21/42.0]
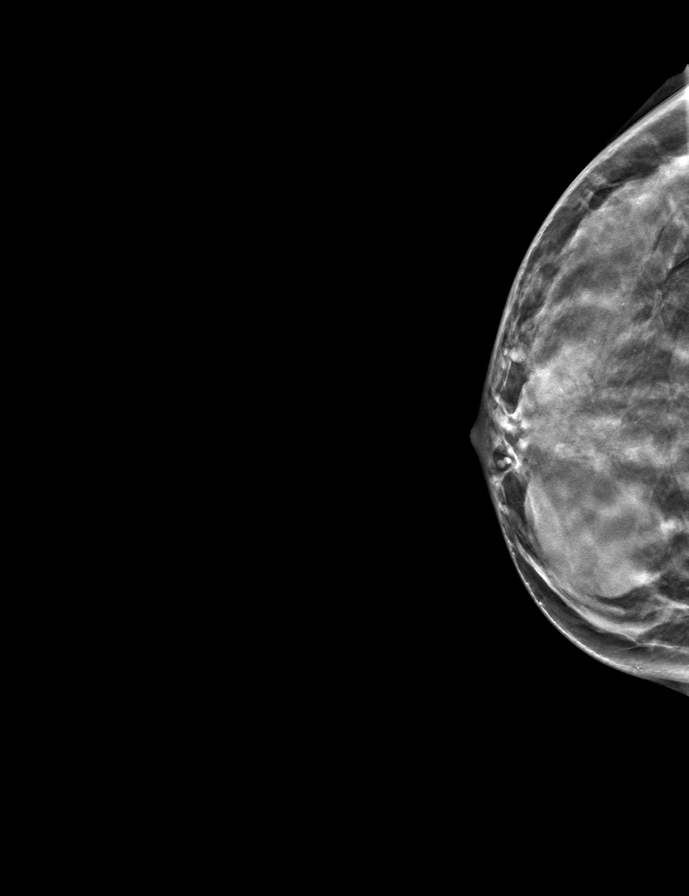

[L MLO tomo · tomo slice 19/37.0]
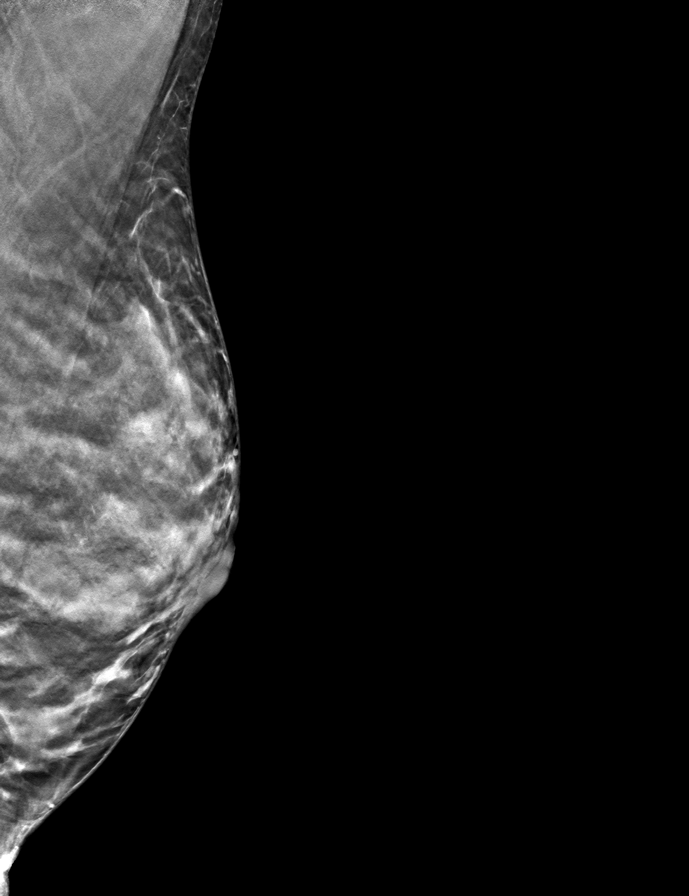

[8 of 24 positions shown; findings below may reference images not displayed]

ACR Breast Density Category c: The breast tissue is heterogeneously
dense, which may obscure small masses.
FINDINGS: In the right breast , a possible mass requires further evaluation.
This mass is seen within the inner RIGHT breast.

In the left breast , calcifications requires further evaluation.
These calcifications are seen within the upper-outer quadrant of the
LEFT breast.
IMPRESSION: Further evaluation is suggested for possible mass in the right
breast. Recommend RIGHT breast ultrasound only for further
characterization.

Further evaluation is suggested for calcifications in the left
breast.

RECOMMENDATION:
1. RIGHT breast ultrasound.
2. LEFT breast diagnostic mammogram, with magnification views, for
the LEFT breast calcifications.

The patient will be contacted regarding the findings, and additional
imaging will be scheduled.

BI-RADS CATEGORY  0: Incomplete. Need additional imaging evaluation
and/or prior mammograms for comparison.

## 2022-06-14 ENCOUNTER — Telehealth: Payer: Self-pay

## 2022-06-14 NOTE — Telephone Encounter (Signed)
Attempted to contact the patient in regards to rescheduling with another provider, LVM for a call back.  Patient advised to call [phone] option #1 for scheduling.

## 2022-12-18 ENCOUNTER — Other Ambulatory Visit: Payer: Self-pay

## 2022-12-18 DIAGNOSIS — F419 Anxiety disorder, unspecified: Secondary | ICD-10-CM

## 2022-12-18 MED ORDER — ALPRAZOLAM 0.25 MG PO TABS: 0.2500 mg | ORAL_TABLET | Freq: Every day | ORAL | 0 refills | Status: AC | PRN

## 2022-12-18 NOTE — Telephone Encounter (Signed)
Pt calling to request refill on Xanax.  Last AEX 04/15/2022--nothing scheduled yet for 2024. However, recall was placed.

## 2024-06-25 ENCOUNTER — Encounter: Payer: Self-pay | Admitting: Advanced Practice Midwife
